# Patient Record
Sex: Male | Born: 1960 | Race: White | Hispanic: No | Smoking: Former smoker
Health system: Southern US, Community
[De-identification: ages and names within clinical notes are randomized; demographics above are authoritative.]

## PROBLEM LIST (undated history)

## (undated) DIAGNOSIS — F319 Bipolar disorder, unspecified: Secondary | ICD-10-CM

## (undated) DIAGNOSIS — Z9049 Acquired absence of other specified parts of digestive tract: Secondary | ICD-10-CM

## (undated) DIAGNOSIS — Z96662 Presence of left artificial ankle joint: Secondary | ICD-10-CM

## (undated) DIAGNOSIS — Z471 Aftercare following joint replacement surgery: Secondary | ICD-10-CM

## (undated) DIAGNOSIS — F419 Anxiety disorder, unspecified: Secondary | ICD-10-CM

## (undated) DIAGNOSIS — Z96693 Finger-joint replacement, bilateral: Secondary | ICD-10-CM

## (undated) HISTORY — DX: Acquired absence of other specified parts of digestive tract: Z90.49

## (undated) HISTORY — DX: Aftercare following joint replacement surgery: Z96.662

## (undated) HISTORY — PX: CHOLECYSTECTOMY: SHX55

## (undated) HISTORY — DX: Aftercare following joint replacement surgery: Z47.1

## (undated) HISTORY — DX: Aftercare following joint replacement surgery: Z96.693

## (undated) HISTORY — PX: ANKLE SURGERY: SHX546

## (undated) HISTORY — PX: FINGER SURGERY: SHX640

---

## 2011-12-09 ENCOUNTER — Emergency Department (HOSPITAL_COMMUNITY): Payer: Self-pay

## 2011-12-09 ENCOUNTER — Encounter (HOSPITAL_COMMUNITY): Payer: Self-pay | Admitting: *Deleted

## 2011-12-09 ENCOUNTER — Emergency Department (HOSPITAL_COMMUNITY)
Admission: EM | Admit: 2011-12-09 | Discharge: 2011-12-09 | Disposition: A | Discharge status: Home / Self Care | Payer: Self-pay | Attending: Emergency Medicine | Admitting: Emergency Medicine

## 2011-12-09 DIAGNOSIS — Z79899 Other long term (current) drug therapy: Secondary | ICD-10-CM | POA: Insufficient documentation

## 2011-12-09 DIAGNOSIS — W19XXXA Unspecified fall, initial encounter: Secondary | ICD-10-CM | POA: Insufficient documentation

## 2011-12-09 DIAGNOSIS — M25519 Pain in unspecified shoulder: Secondary | ICD-10-CM | POA: Insufficient documentation

## 2011-12-09 DIAGNOSIS — S42109A Fracture of unspecified part of scapula, unspecified shoulder, initial encounter for closed fracture: Secondary | ICD-10-CM | POA: Insufficient documentation

## 2011-12-09 DIAGNOSIS — Y9301 Activity, walking, marching and hiking: Secondary | ICD-10-CM | POA: Insufficient documentation

## 2011-12-09 MED ORDER — PROMETHAZINE HCL 12.5 MG PO TABS
12.5000 mg | ORAL_TABLET | Freq: Once | ORAL | Status: AC
  Administered 2011-12-09: 12.5 mg via ORAL
  Filled 2011-12-09: qty 1

## 2011-12-09 MED ORDER — HYDROCODONE-ACETAMINOPHEN 5-325 MG PO TABS: ORAL_TABLET | ORAL | Status: DC

## 2011-12-09 MED ORDER — HYDROCODONE-ACETAMINOPHEN 5-325 MG PO TABS
2.0000 | ORAL_TABLET | Freq: Once | ORAL | Status: AC
  Administered 2011-12-09: 2 via ORAL
  Filled 2011-12-09: qty 2

## 2011-12-09 MED ORDER — MELOXICAM 7.5 MG PO TABS
7.5000 mg | ORAL_TABLET | Freq: Every day | ORAL | Status: DC
Start: 2011-12-09 — End: 2011-12-13

## 2011-12-09 MED ORDER — KETOROLAC TROMETHAMINE 10 MG PO TABS
10.0000 mg | ORAL_TABLET | Freq: Once | ORAL | Status: AC
  Administered 2011-12-09: 10 mg via ORAL
  Filled 2011-12-09: qty 1

## 2011-12-09 NOTE — ED Notes (Signed)
Fell last night, pipe hit left shoulder. Occurred last night.

## 2011-12-09 NOTE — ED Notes (Signed)
Pt presents with left shoulder pain after falling and having a pipe land on shoulder last night. Pt has painful ROM. Skin WDI. Sensation intact. NAD at this time. Will continue to monitor.

## 2011-12-09 NOTE — ED Provider Notes (Signed)
History     CSN: [id number]  Arrival date & time 12/09/11  1436   First MD Initiated Contact with Patient 12/09/11 1615      Chief Complaint  Patient presents with  . Fall  . Shoulder Pain    (Consider location/radiation/quality/duration/timing/severity/associated sxs/prior treatment) Patient is a 51 y.o. male presenting with fall and shoulder pain. The history is provided by the patient.  Fall The accident occurred yesterday. The fall occurred while walking. Distance fallen: standing position. Impact surface: a pipe. The point of impact was the left shoulder. The pain is present in the left shoulder. The pain is severe. He was ambulatory at the scene. Pertinent negatives include no numbness, no abdominal pain, no bowel incontinence, no hematuria and no loss of consciousness. The symptoms are aggravated by activity. He has tried nothing for the symptoms. The treatment provided no relief.  Shoulder Pain Associated symptoms include coughing. Pertinent negatives include no abdominal pain, arthralgias, chest pain, neck pain or numbness.    History reviewed. No pertinent past medical history.  Past Surgical History  Procedure Date  . Ankle surgery   . Finger surgery     No family history on file.  History  Substance Use Topics  . Smoking status: Current Everyday Smoker  . Smokeless tobacco: Not on file  . Alcohol Use: Yes     Occ      Review of Systems  Constitutional: Negative for activity change.       All ROS Neg except as noted in HPI  HENT: Negative for nosebleeds and neck pain.   Eyes: Negative for photophobia and discharge.  Respiratory: Positive for cough. Negative for shortness of breath and wheezing.   Cardiovascular: Negative for chest pain and palpitations.  Gastrointestinal: Negative for abdominal pain, blood in stool and bowel incontinence.  Genitourinary: Negative for dysuria, frequency and hematuria.  Musculoskeletal: Negative for back pain and  arthralgias.  Skin: Negative.   Neurological: Negative for dizziness, seizures, loss of consciousness, speech difficulty and numbness.  Psychiatric/Behavioral: Negative for hallucinations and confusion.    Allergies  Review of patient's allergies indicates no known allergies.  Home Medications   Current Outpatient Rx  Name Route Sig Dispense Refill  . ALPRAZOLAM 1 MG PO TABS Oral Take 1 mg by mouth 4 (four) times daily.    Marland Kitchen CARBAMAZEPINE 200 MG PO TABS Oral Take 200 mg by mouth 2 (two) times daily.    . TRAZODONE HCL 100 MG PO TABS Oral Take 100 mg by mouth at bedtime.    Marland Kitchen HYDROCODONE-ACETAMINOPHEN 5-325 MG PO TABS  1 or 2 po q4h prn pain 20 tablet 0  . MELOXICAM 7.5 MG PO TABS Oral Take 1 tablet (7.5 mg total) by mouth daily. 12 tablet 0    BP 136/81  Pulse 87  Temp(Src) 97.8 F (36.6 C) (Oral)  Resp 20  Ht 5\' 10"  (1.778 m)  Wt 180 lb (81.647 kg)  BMI 25.83 kg/m2  SpO2 100%  Physical Exam  Nursing note and vitals reviewed. Constitutional: He is oriented to person, place, and time. He appears well-developed and well-nourished.  Non-toxic appearance.  HENT:  Head: Normocephalic.  Right Ear: Tympanic membrane and external ear normal.  Left Ear: Tympanic membrane and external ear normal.  Eyes: EOM and lids are normal. Pupils are equal, round, and reactive to light.  Neck: Normal range of motion. Neck supple. Carotid bruit is not present.  Cardiovascular: Normal rate, regular rhythm, normal heart sounds, intact distal pulses  and normal pulses.   Pulmonary/Chest: No respiratory distress. He has rhonchi.         Deformity of the clavicle .    Abdominal: Soft. Bowel sounds are normal. There is no tenderness. There is no guarding.  Musculoskeletal: Normal range of motion.       Distal pulses and sensory wnl. No pain of the left elbow or wrist.  Lymphadenopathy:       Head (right side): No submandibular adenopathy present.       Head (left side): No submandibular adenopathy  present.    He has no cervical adenopathy.  Neurological: He is alert and oriented to person, place, and time. He has normal strength. No cranial nerve deficit or sensory deficit.  Skin: Skin is warm and dry.  Psychiatric: He has a normal mood and affect. His speech is normal.    ED Course  Procedures (including critical care time)  Labs Reviewed - No data to display Dg Shoulder Left  12/09/2011  *RADIOLOGY REPORT*  Clinical Data: Severe left shoulder pain following a fall last night.  LEFT SHOULDER - 2+ VIEW  Comparison: None.  Findings: Old fracture of the mid to distal left clavicle with corticated margins and nonunion.  There is almost two shafts width of the inferior displacement as well as inferior angulation of the distal fragment as well as 4.6 cm of overlapping of the fragments. There is also an old, healed 6th posterolateral rib fracture. There is a fracture of the distal scapular body which appears possibly acute on the lateral scapular views.  IMPRESSION:  1.  Possible acute scapular fracture, as described above. 2.  Old left clavicle and sixth rib fractures.  Original Report Authenticated By: Sean Young, M.D.     1. Scapula fracture       MDM  I have reviewed nursing notes, vital signs, and all appropriate lab and imaging results for this patient. Pt sustained a fall last night. He fell on a pipe and injured the left scapula an shoulder. 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 Xray reveals possible scapular fracture.  Pt fitted with shoulder immobilizer and treated with mobic and norco. He is to call Dr Sean Young tomorrow for office appointment.       Kathie Dike, [place] 12/09/11 [phone]

## 2011-12-09 NOTE — Discharge Instructions (Signed)
You have a fracture of the shoulder blade on the left. Please call Dr Romeo Apple for orthopedic evaluation an management of your fracture. Mobic 2 times daily with food. Norco for pain if needed.Scapular Fracture You have a fracture (break in bone) of your scapula. This is your shoulder blade. It is the large flat bone behind your shoulder. This is also the bone that makes up the ball and socket joint of your shoulder. Most of the time surgery is not required for injuries to this bone unless the socket of the shoulder joint is involved. DIAGNOSIS  Because of the severity of force usually required to break this bone, x-rays are often taken of other bones likely to be injured at the same time. X-rays of the hip, knee, and pelvis may be taken. Specialized x-rays (arteriograms) may be needed if there are injuries to large blood vessels associated with this injury. HOME CARE INSTRUCTIONS   Simple fractures of the scapula can be treated with a sling and swathe type of immobilization. This means the involved area is held in place by putting the arm in a sling. A wrap is made around the upper arm with the sling holding the arm next to the chest. This may be removed for bathing as instructed by your caregiver.   Apply ice to the injury for 15 to 20 minutes 3 to 4 times per day. Put the ice in a plastic bag. Place a towel between the bag of ice and your skin, splint, or immobilization device.   Do not resume use until instructed by your caregiver. Usually full rehabilitation (exercises to improve the injury site) will begin sometime after the sling and swathe are removed. Then begin use gradually as directed. Do not increase use to the point of pain. If pain develops, decrease use and continue the above measures. Slowly increase activities that do not cause discomfort until you gradually achieve normal use without pain.   Only take over-the-counter or prescription medicines for pain, discomfort, or fever as directed  by your caregiver.  SEEK IMMEDIATE MEDICAL CARE IF:   Your pain and swelling increase and is uncontrolled with medications.   You develop new, unexplained symptoms or an increase of the symptoms which brought you to your caregiver.   You develop shortness or breath or cough up blood.   You are unable to move your arm or fingers. You develop warmth and swelling in your affected arm.   You develop an unexplained temperature.  Document Released: 07/30/2005 Document Revised: 07/19/2011 Document Reviewed: 06/21/2006 [facility] Patient Information 2012 [place], [place].

## 2011-12-09 NOTE — ED Notes (Signed)
Pt a/ox4. Resp even and unlabored. NAD at this time. D/C instructions reviewed with pt. Pt verbalized understanding. Pt ambulated to lobby with steady gate.  

## 2011-12-13 ENCOUNTER — Encounter (HOSPITAL_COMMUNITY): Payer: Self-pay | Admitting: Emergency Medicine

## 2011-12-13 ENCOUNTER — Emergency Department (HOSPITAL_COMMUNITY)
Admission: EM | Admit: 2011-12-13 | Discharge: 2011-12-13 | Disposition: A | Discharge status: Home / Self Care | Payer: Self-pay | Attending: Emergency Medicine | Admitting: Emergency Medicine

## 2011-12-13 DIAGNOSIS — F17200 Nicotine dependence, unspecified, uncomplicated: Secondary | ICD-10-CM | POA: Insufficient documentation

## 2011-12-13 DIAGNOSIS — M25519 Pain in unspecified shoulder: Secondary | ICD-10-CM | POA: Insufficient documentation

## 2011-12-13 DIAGNOSIS — M2550 Pain in unspecified joint: Secondary | ICD-10-CM | POA: Insufficient documentation

## 2011-12-13 MED ORDER — OXYCODONE-ACETAMINOPHEN 5-325 MG PO TABS: 1.0000 | ORAL_TABLET | ORAL | Status: AC | PRN

## 2011-12-13 NOTE — ED Provider Notes (Signed)
History     CSN: [id number]  Arrival date & time 12/13/11  1709   First MD Initiated Contact with Patient 12/13/11 1721      Chief Complaint  Patient presents with  . Medication Refill    (Consider location/radiation/quality/duration/timing/severity/associated sxs/prior treatment) HPI Comments: Patient c/o persistent pain to the left scapula.  States he was seen here and treated for a scapula fracture 4 days ago and states the pain medication he was given is not controlling the pain.  States he took his last one yesterday.  He states he has appt with Dr. Romeo Apple on Monday.  He denies numbness, weakness, swelling , neck pain or chest pain  Patient is a 51 y.o. male presenting with shoulder pain. The history is provided by the patient.  Shoulder Pain This is a new problem. The current episode started in the past 7 days. The problem occurs constantly. The problem has been unchanged. Associated symptoms include arthralgias. Pertinent negatives include no chest pain, fever, headaches, joint swelling, myalgias, neck pain, numbness, rash or weakness. Exacerbated by: movement and palpation. He has tried oral narcotics for the symptoms. The treatment provided no relief.    History reviewed. No pertinent past medical history.  Past Surgical History  Procedure Date  . Ankle surgery   . Finger surgery     History reviewed. No pertinent family history.  History  Substance Use Topics  . Smoking status: Current Everyday Smoker  . Smokeless tobacco: Not on file  . Alcohol Use: Yes     Occ      Review of Systems  Constitutional: Negative for fever.  HENT: Negative for neck pain and neck stiffness.   Respiratory: Negative for chest tightness and shortness of breath.   Cardiovascular: Negative for chest pain.  Musculoskeletal: Positive for arthralgias. Negative for myalgias, back pain and joint swelling.  Skin: Negative for rash.  Neurological: Negative for dizziness, weakness, numbness  and headaches.  All other systems reviewed and are negative.    Allergies  Review of patient's allergies indicates no known allergies.  Home Medications   Current Outpatient Rx  Name Route Sig Dispense Refill  . ALPRAZOLAM 1 MG PO TABS Oral Take 1 mg by mouth 4 (four) times daily.    Marland Kitchen CARBAMAZEPINE 200 MG PO TABS Oral Take 200 mg by mouth 2 (two) times daily.    . METHYLPHENIDATE HCL 10 MG PO TABS Oral Take 10 mg by mouth 2 (two) times daily.    . METHYLPHENIDATE HCL 20 MG PO TABS Oral Take 20 mg by mouth 2 (two) times daily.    . TRAZODONE HCL 100 MG PO TABS Oral Take 100 mg by mouth at bedtime.    Marland Kitchen HYDROCODONE-ACETAMINOPHEN 5-325 MG PO TABS Oral Take 1 tablet by mouth every 6 (six) hours as needed. For pain      BP 140/68  Pulse 86  Temp(Src) 97.6 F (36.4 C) (Oral)  Resp 16  Ht 5\' 10"  (1.778 m)  Wt 180 lb (81.647 kg)  BMI 25.83 kg/m2  SpO2 100%  Physical Exam  Nursing note and vitals reviewed. Constitutional: He is oriented to person, place, and time. He appears well-developed and well-nourished. No distress.  HENT:  Head: Normocephalic and atraumatic.  Mouth/Throat: Oropharynx is clear and moist.  Neck: Normal range of motion. Neck supple.  Cardiovascular: Normal rate, regular rhythm and normal heart sounds.   Pulmonary/Chest: Effort normal and breath sounds normal. No respiratory distress. He exhibits no tenderness.  Musculoskeletal: He  exhibits tenderness. He exhibits no edema.       Left shoulder: He exhibits decreased range of motion, tenderness, bony tenderness and pain. He exhibits no swelling, no effusion, no crepitus, no deformity, no laceration, no spasm, normal pulse and normal strength.       Arms:      Localized ttp of the mid to distal portion of the left scapula,  No bruising or edema.  Pain also reproduced with abduction of the left arm.  Lymphadenopathy:    He has no cervical adenopathy.  Neurological: He is alert and oriented to person, place, and  time. He exhibits normal muscle tone. Coordination normal.       Left radial pulse is brisk, CR,<2 sec, sensation intact.  Skin: Skin is warm and dry.    ED Course  Procedures (including critical care time)       MDM    Previous medical charts, nursing notes and vitals signs from this visit were reviewed by me   All laboratory results and/or imaging results performed on this visit, if applicable, were reviewed by me and discussed with the patient and/or parent as well as recommendation for follow-up    MEDICATIONS GIVEN IN ED: none  Patient here 12/09/11 diagnosed with left scapular fx, wearing a sling.  Remains NV intact.  ttp of the distal portion of the scapula. No chest wall or cervical tenderness. States he has appt with Dr. Romeo Apple for Monday.  Requesting pain medication until appt.        PRESCRIPTIONS GIVEN AT DISCHARGE:  Percocet # 10     Pt stable in ED with no significant deterioration in condition. Pt feels improved after observation and/or treatment in ED. Patient / Family / Caregiver understand and agree with initial ED impression and plan with expectations set for ED visit.  Patient agrees to return to ED for any worsening symptoms        Jabir Dahlem L. [place], [place] 12/13/11 1811

## 2011-12-13 NOTE — ED Provider Notes (Signed)
Medical screening examination/treatment/procedure(s) were performed by non-physician practitioner and as supervising physician I was immediately available for consultation/collaboration.  Eschol Auxier, MD 12/13/11 2237 

## 2011-12-13 NOTE — Discharge Instructions (Signed)
Shoulder Pain  The shoulder is a ball and socket joint. Many muscles and tendons hold the joint together. Many types of injuries and medical problems can cause pain in one or more parts of the shoulder.  HOME CARE   If your doctor feels the problem is not serious, it may help to do the following:  · Put ice on the area.  · Put ice in a plastic bag.  · Place a towel between your skin and the bag.  · Leave the ice on for 15 to 20 minutes, 3 to 4 times a day.  · Do this for the first 2 day or as told by your doctor.  · Stop using cold packs if they do not help with the pain.  · Do not take your sling off (except to shower or bathe) until you see your doctor. When taking off the sling, move the arm as little as possible.  · Take medicine as told by your doctor.  · Keep all follow-up appointments.  GET HELP RIGHT AWAY IF:   · The arm, hand, or fingers are numb or tingling.  · The arm, hand, or fingers are puffy (swollen), painful, or turn white or blue.  · You have trouble moving your hand and fingers on the injured side.  · You have chest pain or shortness of breath.  · New pain happens in the arm, hand, or fingers.  · The hand or fingers on the injured side become cold.  · The medicine is not helping the pain go away.  MAKE SURE YOU:   · Understand these instructions.  · Will watch your condition.  · Will get help right away if you are not doing well or get worse.  Document Released: 01/16/2008 Document Revised: 07/19/2011 Document Reviewed: 01/16/2008  [employer]® Patient Information ©2012 [employer].

## 2011-12-13 NOTE — ED Notes (Signed)
Pt seen in ED for left broken shoulder on Sunday. Pt unable to get in with Dr.Harrison until Monday afternoon. Pt here for pain medication to last until appointment.

## 2011-12-17 ENCOUNTER — Encounter: Payer: Self-pay | Admitting: Orthopedic Surgery

## 2011-12-17 ENCOUNTER — Ambulatory Visit: Payer: Self-pay | Admitting: Orthopedic Surgery

## 2011-12-27 NOTE — ED Provider Notes (Signed)
Medical screening examination/treatment/procedure(s) were performed by non-physician practitioner and as supervising physician I was immediately available for consultation/collaboration.  Nicoletta Dress. Colon Branch, MD 12/27/11 [phone]

## 2014-03-08 ENCOUNTER — Emergency Department (HOSPITAL_COMMUNITY)
Admission: EM | Admit: 2014-03-08 | Discharge: 2014-03-08 | Disposition: A | Discharge status: Home / Self Care | Payer: Self-pay | Attending: Emergency Medicine | Admitting: Emergency Medicine

## 2014-03-08 ENCOUNTER — Encounter (HOSPITAL_COMMUNITY): Payer: Self-pay | Admitting: Emergency Medicine

## 2014-03-08 DIAGNOSIS — Z792 Long term (current) use of antibiotics: Secondary | ICD-10-CM | POA: Insufficient documentation

## 2014-03-08 DIAGNOSIS — L02416 Cutaneous abscess of left lower limb: Secondary | ICD-10-CM

## 2014-03-08 DIAGNOSIS — Z79899 Other long term (current) drug therapy: Secondary | ICD-10-CM | POA: Insufficient documentation

## 2014-03-08 DIAGNOSIS — L03119 Cellulitis of unspecified part of limb: Principal | ICD-10-CM

## 2014-03-08 DIAGNOSIS — L02419 Cutaneous abscess of limb, unspecified: Secondary | ICD-10-CM | POA: Insufficient documentation

## 2014-03-08 DIAGNOSIS — L03116 Cellulitis of left lower limb: Secondary | ICD-10-CM

## 2014-03-08 DIAGNOSIS — Z87891 Personal history of nicotine dependence: Secondary | ICD-10-CM | POA: Insufficient documentation

## 2014-03-08 DIAGNOSIS — F319 Bipolar disorder, unspecified: Secondary | ICD-10-CM | POA: Insufficient documentation

## 2014-03-08 DIAGNOSIS — F411 Generalized anxiety disorder: Secondary | ICD-10-CM | POA: Insufficient documentation

## 2014-03-08 HISTORY — DX: Bipolar disorder, unspecified: F31.9

## 2014-03-08 HISTORY — DX: Anxiety disorder, unspecified: F41.9

## 2014-03-08 MED ORDER — SULFAMETHOXAZOLE-TRIMETHOPRIM 200-40 MG/5ML PO SUSP: 20.0000 mL | Freq: Once | ORAL | Status: DC

## 2014-03-08 MED ORDER — SULFAMETHOXAZOLE-TRIMETHOPRIM 800-160 MG PO TABS: 1.0000 | ORAL_TABLET | Freq: Two times a day (BID) | ORAL | Status: DC

## 2014-03-08 MED ORDER — POVIDONE-IODINE 10 % EX SOLN
CUTANEOUS | Status: AC
  Filled 2014-03-08: qty 118

## 2014-03-08 MED ORDER — LIDOCAINE-EPINEPHRINE (PF) 1 %-1:200000 IJ SOLN: 10.0000 mL | Freq: Once | INTRAMUSCULAR | Status: DC

## 2014-03-08 MED ORDER — OXYCODONE-ACETAMINOPHEN 5-325 MG PO TABS
1.0000 | ORAL_TABLET | ORAL | Status: DC | PRN
Start: 2014-03-08 — End: 2020-02-05

## 2014-03-08 MED ORDER — SULFAMETHOXAZOLE-TMP DS 800-160 MG PO TABS
1.0000 | ORAL_TABLET | Freq: Once | ORAL | Status: AC
  Administered 2014-03-08: 1 via ORAL
  Filled 2014-03-08: qty 1

## 2014-03-08 MED ORDER — IBUPROFEN 400 MG PO TABS
600.0000 mg | ORAL_TABLET | Freq: Once | ORAL | Status: AC
  Administered 2014-03-08: 600 mg via ORAL
  Filled 2014-03-08: qty 2

## 2014-03-08 MED ORDER — OXYCODONE-ACETAMINOPHEN 5-325 MG PO TABS
2.0000 | ORAL_TABLET | Freq: Once | ORAL | Status: AC
  Administered 2014-03-08: 2 via ORAL
  Filled 2014-03-08: qty 2

## 2014-03-08 NOTE — ED Notes (Signed)
Pt states he noticed area behind right knee after mowing 5 days ago. Redness and swelling are present. Denies drainage from wound. Pt states entire right leg is hurting and is causing difficulty walking due to pain.

## 2014-03-08 NOTE — ED Notes (Signed)
I&D tray at bedside.

## 2014-03-08 NOTE — Discharge Instructions (Signed)
Abscess °An abscess is an infected area that contains a collection of pus and debris. It can occur in almost any part of the body. An abscess is also known as a furuncle or boil. °CAUSES  °An abscess occurs when tissue gets infected. This can occur from blockage of oil or sweat glands, infection of hair follicles, or a minor injury to the skin. As the body tries to fight the infection, pus collects in the area and creates pressure under the skin. This pressure causes pain. People with weakened immune systems have difficulty fighting infections and get certain abscesses more often.  °SYMPTOMS °Usually an abscess develops on the skin and becomes a painful mass that is red, warm, and tender. If the abscess forms under the skin, you may feel a moveable soft area under the skin. Some abscesses break open (rupture) on their own, but most will continue to get worse without care. The infection can spread deeper into the body and eventually into the bloodstream, causing you to feel ill.  °DIAGNOSIS  °Your caregiver will take your medical history and perform a physical exam. A sample of fluid may also be taken from the abscess to determine what is causing your infection. °TREATMENT  °Your caregiver may prescribe antibiotic medicines to fight the infection. However, taking antibiotics alone usually does not cure an abscess. Your caregiver may need to make a small cut (incision) in the abscess to drain the pus. In some cases, gauze is packed into the abscess to reduce pain and to continue draining the area. °HOME CARE INSTRUCTIONS  °· Only take over-the-counter or prescription medicines for pain, discomfort, or fever as directed by your caregiver. °· If you were prescribed antibiotics, take them as directed. Finish them even if you start to feel better. °· If gauze is used, follow your caregiver's directions for changing the gauze. °· To avoid spreading the infection: °¨ Keep your draining abscess covered with a  bandage. °¨ Wash your hands well. °¨ Do not share personal care items, towels, or whirlpools with others. °¨ Avoid skin contact with others. °· Keep your skin and clothes clean around the abscess. °· Keep all follow-up appointments as directed by your caregiver. °SEEK MEDICAL CARE IF:  °· You have increased pain, swelling, redness, fluid drainage, or bleeding. °· You have muscle aches, chills, or a general ill feeling. °· You have a fever. °MAKE SURE YOU:  °· Understand these instructions. °· Will watch your condition. °· Will get help right away if you are not doing well or get worse. °Document Released: 05/09/2005 Document Revised: 01/29/2012 Document Reviewed: 10/12/2011 °ExitCare® Patient Information ©2015 [employer]. This information is not intended to replace advice given to you by your health care provider. Make sure you discuss any questions you have with your health care provider. ° ° °Emergency Department Resource Guide °1) Find a Doctor and Pay Out of Pocket °Although you won't have to find out who is covered by your insurance plan, it is a good idea to ask around and get recommendations. You will then need to call the office and see if the doctor you have chosen will accept you as a new patient and what types of options they offer for patients who are self-pay. Some doctors offer discounts or will set up payment plans for their patients who do not have insurance, but you will need to ask so you aren't surprised when you get to your appointment. ° °2) Contact Your Local Health Department °Not all health departments   have doctors that can see patients for sick visits, but many do, so it is worth a call to see if yours does. If you don't know where your local health department is, you can check in your phone book. The CDC also has a tool to help you locate your state's health department, and many state websites also have listings of all of their local health departments. ° °3) Find a Walk-in Clinic °If  your illness is not likely to be very severe or complicated, you may want to try a walk in clinic. These are popping up all over the country in pharmacies, drugstores, and shopping centers. They're usually staffed by nurse practitioners or physician assistants that have been trained to treat common illnesses and complaints. They're usually fairly quick and inexpensive. However, if you have serious medical issues or chronic medical problems, these are probably not your best option. ° °No Primary Care Doctor: °- Call [facility] at  832-8000 - they can help you locate a primary care doctor that  accepts your insurance, provides certain services, etc. °- Physician Referral Service- 1-800-533-3463 ° °Chronic Pain Problems: °Organization         Address  Phone   Notes  °[facility]  (336) 297-2271 Patients need to be referred by their primary care doctor.  ° °Medication Assistance: °Organization         Address  Phone   Notes  °[place] [facility] [address] °[place], [place] [address] (336) 641-8030 --Must be a resident of [place] °-- Must have NO insurance coverage whatsoever (no Medicaid/ Medicare, etc.) °-- The pt. MUST have a primary care doctor that directs their care regularly and follows them in the community °  °[facility]  (866) 331-1348   °[employer]  (888) 892-1162   ° °Agencies that provide inexpensive medical care: °Organization         Address  Phone   Notes  °[facility]  (336) 832-8035   °[facility]    (336) 832-7272   °[facility] [address] °[place], [place] [address] (336) 832-4777   °[facility] [address], °[place] (336) 271-4999   °[facility]    (336) 373-0678   °[facility]    (336) 272-1050   °[facility] ° [address], [place] Phone:  (336) 832-4444, Fax:  (336) 832-4440 Hours of  Operation:  9 am - 6 pm, M-F.  Also accepts Medicaid/Medicare and self-pay.  °[facility] ° [address], [place] Phone: (336) 832-3150, Fax: (336) 832-3151. Hours of Operation:  8:30 am - 5:30 pm, M-F.  Also accepts Medicaid and self-pay.  °[facility] [place] [address], [place] Phone: (336) 878-6027   °[facility] [address], [place], [place] (336)723-1848, Ext. 123 Mondays & Thursdays: 7-9 AM.  First 15 patients are seen on a first come, first serve basis. °  ° °Medicaid-accepting [place] Providers: ° °Organization         Address  Phone   Notes  °[facility] [address], [place] (336) 641-2100 Also accepts self-pay patients.  °[facility] [address], [place] ° (336) 856-9996   °[facility] [address], [place] (336) 288-8857   °[facility]   [address] [place] [address], [place] (336) 299-7000   °Veita Bland [address], [place]  ° (336) 373-1557 Only accepts [place] Access Medicaid patients after they have their name applied to their card.  ° °Self-Pay (no insurance) in [place]: ° °Organization         Address  Phone   Notes  °Sickle Cell Patients, [facility] [address], [place] (336) 832-1970   °[facility] [address], [place] (336) 832-4400   ° [facility] ° [address], [place] (336) 992-4800   °[facility]/Dr. Osei-Bonsu ° [address] [place] [address], [place] or [address], [place] (336) 841-8500 Phone number for both [place] and [place] locations is the same.  °[facility] [address], [place] (336) 299-0000   °[facility] [address] [place] [address], [place] or [address] (336) 852-7530 °(336) 878-2260   °[facility] [address], [place] (336) 350-1642, phone; (336) 294-5005, fax Sees patients 1st and 3rd Saturday of every month.  Must not qualify for public or private insurance (i.e. Medicaid, Medicare, [place] Health Choice, Veterans' Benefits) • Household income should be no more than 200% of the poverty level •The clinic cannot treat you if you are pregnant or think you are pregnant • Sexually transmitted diseases are not treated at the clinic.  ° ° °Dental Care: °Organization         Address  Phone  Notes  °[facility] [address], [place] (336) 641-6152 Accepts children up to age 21 who are enrolled in Medicaid or [place] Health Choice; pregnant women with a Medicaid card; and children who have applied for Medicaid or [place] Health Choice, but were declined, whose parents can pay a reduced fee at time of service.  °[facility] [place]  [address], [place] (336) 641-7733 Accepts children up to age 21 who are enrolled in Medicaid or [place] Health Choice; pregnant women with a Medicaid card; and children who have applied for Medicaid or  Health Choice, but were declined, whose parents can pay a reduced fee at time of service.  °[facility] ° [address], [place] (336) 641-4533 Patients are seen by appointment only. Walk-ins are not accepted. [facility] will see patients 18 years of age and older. °Monday - Tuesday (8am-5pm) °Most Wednesdays (8:30-5pm) °$30 per visit, cash only  °[facility] ° [address], [place] (336) 641-4533 Patients are seen by appointment only. Walk-ins are not accepted. [facility] will see patients 18 years of age and older. °One Wednesday Evening (Monthly: Volunteer Based).  $30 per visit, cash only  °[facility]  (919) 537-3737 for adults; Children under age 4, call Graduate Pediatric  Dentistry at ([phone]) [phone]. Children aged 4-14, please call (919) [phone] to request a pediatric application. ° Dental services are provided in all areas of dental care including fillings, crowns and bridges, complete and partial dentures, implants, gum treatment, root canals, and extractions. Preventive care is also provided. Treatment is provided to both adults and children. °Patients are selected via a lottery and there is often a waiting list. °  °[facility] [address], ° ° (336) 763-8833 [email or web address] °  °[facility]   [address], [place], [place] (336)723-1848, Ext. 123 Second and Fourth Thursday of each month, opens at 6:30 AM; Clinic ends at 9 AM.  Patients are seen on a first-come first-served basis, and a limited number are seen during each clinic.  ° °[facility] ° [address], [place], [place] (336) 723-7904   Eligibility Requirements °You must have lived in [place], [place], or [place] counties for at least the last three months. °  You cannot be eligible for state or federal sponsored healthcare insurance, including Veterans Administration, Medicaid, or Medicare. °  You generally cannot be eligible for healthcare insurance through your employer.  °  How to apply: °Eligibility screenings are held every Tuesday and Wednesday afternoon from 1:00 pm until 4:00 pm. You do not need an appointment for the interview!  °[facility] [address], [place], [place] 336-631-2330   °[facility]  336-342-8273   °[facility]  336-703-3100   °[facility]  336-570-6415   ° °Behavioral Health Resources in the Community: °Intensive Outpatient Programs °Organization         Address  Phone  Notes  °[facility] [address], [place], [place] 336-878-6098   °[facility] [address], [place], [place] 336-832-9800   °[facility] [address], [place], [place] ° 336-882-2125   °[facility] [address],  °[place], [place] 1-800-853-5163 or 336-641-4981   °Substance Abuse Resources °Organization         Address  Phone  Notes  °[facility]  336-882-2125   °[facility]  336-784-9470   °[facility]  336-285-9073   °[facility]  336-845-3988   °Residential & Outpatient Substance Abuse Program  1-800-659-3381   °Psychological Services °Organization         Address  Phone  Notes  °[facility]  336- 832-9600   °[facility]  336- 378-7881   °[facility] [address], [place] 1-800-853-5163 or 336-641-4981   ° °Mobile Crisis Teams °Organization         Address  Phone  Notes  °[facility], Mobile Crisis Care Unit  1-877-626-1772   °Assertive °[facility] ° [address] [place], [place] 336-834-9664   °Sharon DeEsch [address] °[place] 336-554-5454   ° °Self-Help/Support Groups °Organization         Address  Phone             Notes  °[facility] - variety of support groups  336- 373-1402 Call for more information  °Narcotics Anonymous (NA), [facility] [address], °[place]  2 meetings at this location  ° °Residential Treatment Programs °Organization         Address  Phone  Notes  °[facility] [address],    °[place]  1-866-801-8205   °[facility] ° [address], [place], [place] 704-293-8524   °[facility] [address], [place] 336-845-3988 Admissions: 8am-3pm M-F  °[facility] [address]    °[place], [place] 336-841-1104   °[facility] [address], [place], [place] 336-379-7146   °[facility] [address]  °[place], [place] 336-285-9073   °[facility] - Intensive Outpatient [address],    [place], [place] 336-852-3033   °[facility] [address]  °[place], [place] 1-877-615-2722 or 336-784-9470   °[facility] [address] [place], [place] 336-227-7417 Accepts Medicaid  °[facility] [address]  °[place] 1-800-659-3381 Substance Abuse/Addiction Treatment  ° °[place] Behavioral Health Resources °Organization         Address  Phone  Notes  °[facility]  (888) 581-9988   °Julie Brannon, PhD [address] [place], [place]   (336) 349-5553 or (336) 951-0000   °[facility]   [address] °[place], [place] (336) 349-4454   °[facility] [address], [place], [place] (336) 342-8316 Insurance/Medicaid/sponsorship through [facility]  °[facility] [address]                                    [place], [place] (336) 342-8316 Therapy/tele-psych/case  °[facility] [address]  ° [place], [place] (336) 349-2233    °Dr. Arfeen  (336) 349-4544   °[facility] [place]  [employer] [place] [facility] 1) [address],  °2) [address], [place] °3)  [address], [place] (336) 349-3220 °(336) 342-7768 ° °(336) 342-8140   °[place] [facility] (336) 342-1394 or (336) 342-3537 (After Hours)    ° ° ° °

## 2014-03-13 NOTE — ED Provider Notes (Signed)
CSN: [id number]     Arrival date & time 03/08/14  1707 History   First MD Initiated Contact with Patient 03/08/14 1728     Chief Complaint  Patient presents with  . Abscess     (Consider location/radiation/quality/duration/timing/severity/associated sxs/prior Treatment) HPI  53yM with painful RLE lesion. Onset 5d ago. Progressively worsening. First noticed while mowing lawn. Things may have been bitten by spider although didn't specifically see this. No fever or chills. No n/v. Denies IV drug use.   Past Medical History  Diagnosis Date  . Bipolar 1 disorder   . Anxiety    Past Surgical History  Procedure Laterality Date  . Ankle surgery    . Finger surgery     No family history on file. History  Substance Use Topics  . Smoking status: Former Smoker    Quit date: 03/08/2013  . Smokeless tobacco: Not on file  . Alcohol Use: Yes     Comment: Occ    Review of Systems  All systems reviewed and negative, other than as noted in HPI.   Allergies  Review of patient's allergies indicates no known allergies.  Home Medications   Prior to Admission medications   Medication Sig Start Date End Date Taking? Authorizing Provider  ALPRAZolam Prudy Feeler) 1 MG tablet Take 1 mg by mouth 4 (four) times daily.   Yes Historical Provider, MD  carbamazepine (TEGRETOL) 200 MG tablet Take 200 mg by mouth 2 (two) times daily.   Yes Historical Provider, MD  traZODone (DESYREL) 100 MG tablet Take 100 mg by mouth at bedtime.   Yes Historical Provider, MD  oxyCODONE-acetaminophen (PERCOCET/ROXICET) 5-325 MG per tablet Take 1-2 tablets by mouth every 4 (four) hours as needed for severe pain. 03/08/14   Raeford Razor, MD  sulfamethoxazole-trimethoprim (SEPTRA DS) 800-160 MG per tablet Take 1 tablet by mouth every 12 (twelve) hours. 03/08/14   Raeford Razor, MD   BP 142/83  Pulse 89  Temp(Src) 98.2 F (36.8 C) (Oral)  Resp 16  Ht 5\' 10"  (1.778 m)  Wt 180 lb (81.647 kg)  BMI 25.83 kg/m2  SpO2  100% Physical Exam  Nursing note and vitals reviewed. Constitutional: He appears well-developed and well-nourished. No distress.  HENT:  Head: Normocephalic and atraumatic.  Eyes: Conjunctivae are normal. Right eye exhibits no discharge. Left eye exhibits no discharge.  Neck: Neck supple.  Cardiovascular: Normal rate, regular rhythm and normal heart sounds.  Exam reveals no gallop and no friction rub.   No murmur heard. Pulmonary/Chest: Effort normal and breath sounds normal. No respiratory distress.  Abdominal: Soft. He exhibits no distension. There is no tenderness.  Musculoskeletal: He exhibits no edema and no tenderness.  Neurological: He is alert.  Skin: Skin is warm and dry.     Erythematous, indurated lesion in depicted area. No fluctuance but bedside US does show fluid collection. Cellulitis extending both proximally and distally on posterior aspect of leg. Able to actively range knee.  Psychiatric: He has a normal mood and affect. His behavior is normal. Thought content normal.    ED Course  Procedures (including critical care time)  INCISION AND DRAINAGE Performed by: Raeford Razor Consent: Verbal consent obtained. Risks and benefits: risks, benefits and alternatives were discussed Type: abscess  Body area:  LLE  Anesthesia: local infiltration  Incision was made with a scalpel.  Local anesthetic: lidocaine 2% w epinephrine  Anesthetic total: 3  ml  Complexity: complex Blunt dissection to break up loculations  Drainage: purulent  Drainage amount:  moderate  Patient tolerance: Patient tolerated the procedure well with no immediate complications.    Labs Review Labs Reviewed - No data to display  Imaging Review No results found.   EKG Interpretation None      MDM   Final diagnoses:  Abscess of leg without foot, left  Cellulitis of left lower extremity    53 year old male with abscess right lower extremity. Incised and drained. Surrounding  cellulitis. Will place on antibiotics. Continued wound care. Afebrile. No particular complicating features. I feel is appropriate for outpatient treatment at this time. As needed pain medication for couple days. Return precautions were discussed.    Raeford RazorStephen Meghan Tiemann, MD 03/13/14 [phone]

## 2020-02-05 ENCOUNTER — Other Ambulatory Visit: Payer: Self-pay

## 2020-02-05 ENCOUNTER — Emergency Department (HOSPITAL_COMMUNITY): Payer: Medicaid - Out of State

## 2020-02-05 ENCOUNTER — Encounter (HOSPITAL_COMMUNITY): Payer: Self-pay | Admitting: *Deleted

## 2020-02-05 ENCOUNTER — Emergency Department (HOSPITAL_COMMUNITY)
Admission: EM | Admit: 2020-02-05 | Discharge: 2020-02-05 | Disposition: A | Discharge status: Home / Self Care | Payer: Medicaid - Out of State | Attending: Emergency Medicine | Admitting: Emergency Medicine

## 2020-02-05 DIAGNOSIS — W01190A Fall on same level from slipping, tripping and stumbling with subsequent striking against furniture, initial encounter: Secondary | ICD-10-CM | POA: Insufficient documentation

## 2020-02-05 DIAGNOSIS — Z79899 Other long term (current) drug therapy: Secondary | ICD-10-CM | POA: Diagnosis not present

## 2020-02-05 DIAGNOSIS — Y9389 Activity, other specified: Secondary | ICD-10-CM | POA: Insufficient documentation

## 2020-02-05 DIAGNOSIS — Z7951 Long term (current) use of inhaled steroids: Secondary | ICD-10-CM | POA: Insufficient documentation

## 2020-02-05 DIAGNOSIS — Z87891 Personal history of nicotine dependence: Secondary | ICD-10-CM | POA: Insufficient documentation

## 2020-02-05 DIAGNOSIS — Y999 Unspecified external cause status: Secondary | ICD-10-CM | POA: Insufficient documentation

## 2020-02-05 DIAGNOSIS — S2241XA Multiple fractures of ribs, right side, initial encounter for closed fracture: Secondary | ICD-10-CM | POA: Insufficient documentation

## 2020-02-05 DIAGNOSIS — Y929 Unspecified place or not applicable: Secondary | ICD-10-CM | POA: Insufficient documentation

## 2020-02-05 DIAGNOSIS — S299XXA Unspecified injury of thorax, initial encounter: Secondary | ICD-10-CM | POA: Diagnosis present

## 2020-02-05 MED ORDER — NAPROXEN 250 MG PO TABS
500.0000 mg | ORAL_TABLET | Freq: Once | ORAL | Status: AC
  Administered 2020-02-05: 500 mg via ORAL
  Filled 2020-02-05: qty 2

## 2020-02-05 MED ORDER — OXYCODONE-ACETAMINOPHEN 5-325 MG PO TABS: 1.0000 | ORAL_TABLET | ORAL | 0 refills | Status: DC | PRN

## 2020-02-05 NOTE — ED Notes (Signed)
Patient transported to X-ray 

## 2020-02-05 NOTE — ED Provider Notes (Signed)
[facility] EMERGENCY DEPARTMENT Provider Note   CSN: [id number] Arrival date & time: 02/05/20  2011     History Chief Complaint  Patient presents with  . Fall    Sean Young is a 59 y.o. male.  HPI      Sean Young is a 59 y.o. male who presents to the Emergency Department complaining of right sided chest wall pain after a mechanical fall in which he landed on the edge of a coffee table.  He states that he tripped over his dog which caused the fall.  He immediately felt pain along his posterior right ribs.  He states pain is worse with movement, cough, and deep breathing.  Incident occurred 1 day prior to arrival.  He states that he feels movement in his right upper back when he takes a deep breath or coughs.  He denies hemoptysis, abdominal pain, and shortness of breath.  Symptoms improved slightly when he remains at rest.    Past Medical History:  Diagnosis Date  . Anxiety   . Bipolar 1 disorder (HCC)     There are no problems to display for this patient.   Past Surgical History:  Procedure Laterality Date  . ANKLE SURGERY    . CHOLECYSTECTOMY    . FINGER SURGERY         No family history on file.  Social History   Tobacco Use  . Smoking status: Former Smoker    Quit date: 03/08/2013    Years since quitting: 6.9  . Smokeless tobacco: Former Engineer, water Use Topics  . Alcohol use: Yes    Comment: Occ  . Drug use: No    Home Medications Prior to Admission medications   Medication Sig Start Date End Date Taking? Authorizing Provider  albuterol (PROVENTIL HFA) 108 (90 Base) MCG/ACT inhaler Inhale 1-2 puffs into the lungs every 6 (six) hours as needed for wheezing or shortness of breath.  06/18/17  Yes [provider]  albuterol (PROVENTIL) (2.5 MG/3ML) 0.083% nebulizer solution Take 2.5 mg by nebulization every 6 (six) hours as needed for wheezing or shortness of breath.  12/23/19  Yes [provider]  amitriptyline (ELAVIL) 100 MG  tablet Take 100 mg by mouth at bedtime.  01/08/20  Yes [provider]  budesonide (PULMICORT) 0.5 MG/2ML nebulizer solution Inhale 2 mLs into the lungs 2 (two) times daily as needed (for shortness of breath).  12/23/19  Yes [provider]  fexofenadine (ALLEGRA) 180 MG tablet Take 180 mg by mouth daily.  01/25/20  Yes [provider]  fluticasone (FLONASE) 50 MCG/ACT nasal spray Place 2 sprays into the nose daily as needed for allergies or rhinitis.  11/04/19  Yes [provider]  ipratropium (ATROVENT) 0.02 % nebulizer solution Inhale 2.5 mLs into the lungs 2 (two) times daily as needed for wheezing or shortness of breath.  01/25/20  Yes [provider]  pantoprazole (PROTONIX) 40 MG tablet Take 40 mg by mouth daily.  01/08/20  Yes [provider]  predniSONE (DELTASONE) 20 MG tablet Take 20 mg by mouth daily with breakfast.  10/12/19  Yes [provider]  sulfamethoxazole-trimethoprim (SEPTRA DS) 800-160 MG per tablet Take 1 tablet by mouth every 12 (twelve) hours. Patient taking differently: Take 1 tablet by mouth 3 (three) times a week.  03/08/14  Yes Raeford Razor, MD  Tiotropium Bromide-Olodaterol 2.5-2.5 MCG/ACT AERS Inhale 2 puffs into the lungs every morning. 01/30/17  Yes [provider]  Allergies    Tramadol  Review of Systems   Review of Systems  Constitutional: Negative for diaphoresis, fatigue and fever.  Respiratory: Negative for cough and shortness of breath.   Cardiovascular: Positive for chest pain (right rib pain).  Gastrointestinal: Negative for abdominal pain, nausea and vomiting.  Genitourinary: Negative for dysuria, flank pain and hematuria.  Musculoskeletal: Positive for back pain. Negative for neck pain.  Neurological: Negative for weakness, numbness and headaches.    Physical Exam Updated Vital Signs BP (!) 143/85 (BP Location: Right Arm)   Pulse 97   Temp 98.1 F (36.7 C) (Oral)   Resp 20    Ht 5\' 10"  (1.778 m)   Wt 86.2 kg   SpO2 97%   BMI 27.26 kg/m   Physical Exam Vitals and nursing note reviewed.  Constitutional:      General: He is not in acute distress.    Appearance: He is well-developed.  HENT:     Head: Normocephalic and atraumatic.  Cardiovascular:     Rate and Rhythm: Normal rate and regular rhythm.     Comments: DP pulses are strong and palpable bilaterally Pulmonary:     Effort: Pulmonary effort is normal. No respiratory distress.     Breath sounds: Normal breath sounds.  Chest:     Chest wall: Tenderness (focal ttp of the lateral and posterior right ribs.  no ecchymosis or crepitus.  ) present.  Abdominal:     General: There is no distension.     Palpations: Abdomen is soft.     Tenderness: There is no abdominal tenderness.  Musculoskeletal:        General: Tenderness present.     Cervical back: Normal range of motion and neck supple.     Lumbar back: Tenderness present. No swelling, deformity or lacerations. Normal range of motion.     Comments: ttp of the right thoracic paraspinal muscles.  No spinal tenderness.     Skin:    General: Skin is warm and dry.     Capillary Refill: Capillary refill takes less than 2 seconds.     Findings: No rash.  Neurological:     Mental Status: He is alert. Mental status is at baseline.     Sensory: No sensory deficit.     Motor: No abnormal muscle tone.     Coordination: Coordination normal.     Gait: Gait normal.     Deep Tendon Reflexes:     Reflex Scores:      Patellar reflexes are 2+ on the right side and 2+ on the left side.      Achilles reflexes are 2+ on the right side and 2+ on the left side.    ED Results / Procedures / Treatments   Labs (all labs ordered are listed, but only abnormal results are displayed) Labs Reviewed - No data to display  EKG None  Radiology DG Ribs Unilateral W/Chest Right  Result Date: 02/05/2020 CLINICAL DATA:  Status post fall. EXAM: RIGHT RIBS AND CHEST - 3+  VIEW COMPARISON:  None. FINDINGS: Acute fractures are seen involving the sixth and seventh right ribs. A chronic sixth left rib fracture is noted. A chronic deformity of the distal left clavicle is also seen. There is no evidence of pneumothorax or pleural effusion. Both lungs are clear. Heart size and mediastinal contours are within normal limits. IMPRESSION: Acute fractures involving the sixth and seventh right ribs. Electronically Signed   By: 02/07/2020 M.D.   On:  02/05/2020 21:51    Procedures Procedures (including critical care time)  Medications Ordered in ED Medications  naproxen (NAPROSYN) tablet 500 mg (500 mg Oral Given 02/05/20 2330)    ED Course  I have reviewed the triage vital signs and the nursing notes.  Pertinent labs & imaging results that were available during my care of the patient were reviewed by me and considered in my medical decision making (see chart for details).    MDM Rules/Calculators/A&P                         Pt here with right rib pain secondary to a mechanical fall.  He has ttp w/o crepitus or splinting.  Appears uncomfortable. abd is soft and non-tender.  XR shows acute fx's of the 6th and 7th ribs.  No evidence of pneumo and vitals reassuring.     on review of pt's medical records, Creatinine 0.91 on 01/08/20  Pt driving.  Give NSAID here, with short course of percocet.  He appears appropriate for d/c home and agrees to close out pt f/u with PCP.  Strict return precautions given.     Final Clinical Impression(s) / ED Diagnoses Final diagnoses:  Closed fracture of multiple ribs of right side, initial encounter    Rx / DC Orders ED Discharge Orders         Ordered    oxyCODONE-acetaminophen (PERCOCET/ROXICET) 5-325 MG tablet  Every 4 hours PRN     Discontinue  Reprint     02/05/20 2314    oxyCODONE-acetaminophen (PERCOCET/ROXICET) 5-325 MG tablet  Every 4 hours PRN     Discontinue  Reprint     02/05/20 2316           Kem Parkinson,  PA-C 02/08/20 1505    Fredia Sorrow, MD 02/10/20 [phone]

## 2020-02-05 NOTE — ED Notes (Signed)
Pt sitting in chair in the room, pt states that his dog tripped him up and he fell, pt reports some back pain, denies midline tenderness.  Denies numbness or tingling, resps even and unalbored

## 2020-02-05 NOTE — Discharge Instructions (Addendum)
Your x-ray this evening shows 2 broken ribs on the right side.  Try to cough and take deep breaths throughout the day.  Follow-up with your primary care provider for recheck.  Return to the emergency department if you develop any worsening symptoms such as sudden increased pain or shortness of breath.

## 2020-02-05 NOTE — ED Triage Notes (Signed)
Pt tripped over his dog causing him to fall, hitting right side against the coffee table, c/o pain to right rib cage area,

## 2020-02-08 MED FILL — Oxycodone w/ Acetaminophen Tab 5-325 MG: ORAL | Qty: 6 | Status: AC

## 2020-03-05 ENCOUNTER — Emergency Department (HOSPITAL_COMMUNITY): Payer: Medicaid - Out of State

## 2020-03-05 ENCOUNTER — Emergency Department (HOSPITAL_COMMUNITY)
Admission: EM | Admit: 2020-03-05 | Discharge: 2020-03-05 | Disposition: A | Discharge status: Home / Self Care | Payer: Medicaid - Out of State | Attending: Emergency Medicine | Admitting: Emergency Medicine

## 2020-03-05 ENCOUNTER — Encounter (HOSPITAL_COMMUNITY): Payer: Self-pay | Admitting: Emergency Medicine

## 2020-03-05 DIAGNOSIS — W19XXXA Unspecified fall, initial encounter: Secondary | ICD-10-CM

## 2020-03-05 DIAGNOSIS — Z87891 Personal history of nicotine dependence: Secondary | ICD-10-CM | POA: Diagnosis not present

## 2020-03-05 DIAGNOSIS — Y9289 Other specified places as the place of occurrence of the external cause: Secondary | ICD-10-CM | POA: Diagnosis not present

## 2020-03-05 DIAGNOSIS — Y9389 Activity, other specified: Secondary | ICD-10-CM | POA: Insufficient documentation

## 2020-03-05 DIAGNOSIS — W010XXA Fall on same level from slipping, tripping and stumbling without subsequent striking against object, initial encounter: Secondary | ICD-10-CM | POA: Insufficient documentation

## 2020-03-05 DIAGNOSIS — S2231XD Fracture of one rib, right side, subsequent encounter for fracture with routine healing: Secondary | ICD-10-CM | POA: Insufficient documentation

## 2020-03-05 DIAGNOSIS — S299XXA Unspecified injury of thorax, initial encounter: Secondary | ICD-10-CM | POA: Diagnosis present

## 2020-03-05 DIAGNOSIS — S2241XD Multiple fractures of ribs, right side, subsequent encounter for fracture with routine healing: Secondary | ICD-10-CM

## 2020-03-05 DIAGNOSIS — Y999 Unspecified external cause status: Secondary | ICD-10-CM | POA: Diagnosis not present

## 2020-03-05 LAB — COMPREHENSIVE METABOLIC PANEL WITH GFR
ALT: 23 U/L (ref 0–44)
AST: 21 U/L (ref 15–41)
Albumin: 4.5 g/dL (ref 3.5–5.0)
Alkaline Phosphatase: 110 U/L (ref 38–126)
Anion gap: 10 (ref 5–15)
BUN: 17 mg/dL (ref 6–20)
CO2: 23 mmol/L (ref 22–32)
Calcium: 8.9 mg/dL (ref 8.9–10.3)
Chloride: 104 mmol/L (ref 98–111)
Creatinine, Ser: 0.91 mg/dL (ref 0.61–1.24)
GFR calc Af Amer: >60 mL/min
GFR calc non Af Amer: >60 mL/min
Glucose, Bld: 110 mg/dL — ABNORMAL HIGH (ref 70–99)
Potassium: 4.1 mmol/L (ref 3.5–5.1)
Sodium: 137 mmol/L (ref 135–145)
Total Bilirubin: 0.7 mg/dL (ref 0.3–1.2)
Total Protein: 8 g/dL (ref 6.5–8.1)

## 2020-03-05 LAB — CBC WITH DIFFERENTIAL/PLATELET
Abs Immature Granulocytes: 0.06 K/uL (ref 0.00–0.07)
Basophils Absolute: 0.1 K/uL (ref 0.0–0.1)
Basophils Relative: 1 %
Eosinophils Absolute: 0.1 K/uL (ref 0.0–0.5)
Eosinophils Relative: 1 %
HCT: 48.5 % (ref 39.0–52.0)
Hemoglobin: 15.5 g/dL (ref 13.0–17.0)
Immature Granulocytes: 0 %
Lymphocytes Relative: 21 %
Lymphs Abs: 3.2 K/uL (ref 0.7–4.0)
MCH: 28.6 pg (ref 26.0–34.0)
MCHC: 32 g/dL (ref 30.0–36.0)
MCV: 89.5 fL (ref 80.0–100.0)
Monocytes Absolute: 1.8 K/uL — ABNORMAL HIGH (ref 0.1–1.0)
Monocytes Relative: 12 %
Neutro Abs: 10.1 K/uL — ABNORMAL HIGH (ref 1.7–7.7)
Neutrophils Relative %: 65 %
Platelets: 373 K/uL (ref 150–400)
RBC: 5.42 MIL/uL (ref 4.22–5.81)
RDW: 13.8 % (ref 11.5–15.5)
WBC: 15.4 K/uL — ABNORMAL HIGH (ref 4.0–10.5)
nRBC: 0 % (ref 0.0–0.2)

## 2020-03-05 LAB — TROPONIN I (HIGH SENSITIVITY)
Troponin I (High Sensitivity): 3 ng/L (ref <18)
Troponin I (High Sensitivity): 3 ng/L (ref <18)

## 2020-03-05 MED ORDER — FENTANYL CITRATE (PF) 100 MCG/2ML IJ SOLN
50.0000 ug | Freq: Once | INTRAMUSCULAR | Status: AC
  Administered 2020-03-05: 50 ug via INTRAVENOUS
  Filled 2020-03-05: qty 2

## 2020-03-05 MED ORDER — ALBUTEROL SULFATE HFA 108 (90 BASE) MCG/ACT IN AERS: 1.0000 | INHALATION_SPRAY | Freq: Four times a day (QID) | RESPIRATORY_TRACT | 0 refills | Status: DC | PRN

## 2020-03-05 MED ORDER — ONDANSETRON HCL 4 MG/2ML IJ SOLN
4.0000 mg | Freq: Once | INTRAMUSCULAR | Status: AC
  Administered 2020-03-05: 4 mg via INTRAVENOUS
  Filled 2020-03-05: qty 2

## 2020-03-05 MED ORDER — METHYLPREDNISOLONE SODIUM SUCC 125 MG IJ SOLR
125.0000 mg | Freq: Once | INTRAMUSCULAR | Status: AC
  Administered 2020-03-05: 125 mg via INTRAVENOUS
  Filled 2020-03-05: qty 2

## 2020-03-05 MED ORDER — ALBUTEROL SULFATE HFA 108 (90 BASE) MCG/ACT IN AERS
2.0000 | INHALATION_SPRAY | Freq: Once | RESPIRATORY_TRACT | Status: AC
  Administered 2020-03-05: 2 via RESPIRATORY_TRACT
  Filled 2020-03-05: qty 6.7

## 2020-03-05 MED ORDER — OXYCODONE-ACETAMINOPHEN 5-325 MG PO TABS: 1.0000 | ORAL_TABLET | Freq: Four times a day (QID) | ORAL | 0 refills | Status: DC | PRN

## 2020-03-05 MED ORDER — MORPHINE SULFATE (PF) 4 MG/ML IV SOLN
4.0000 mg | Freq: Once | INTRAVENOUS | Status: AC
  Administered 2020-03-05: 4 mg via INTRAVENOUS
  Filled 2020-03-05: qty 1

## 2020-03-05 NOTE — ED Triage Notes (Signed)
Pt feel 3 weeks ago over his dog and cracked ribs 6 and 7. Last night, he fell again on the same side. Is having right sided rib pain and feels he has punctured a lung. O2 sats 95% on RA.

## 2020-03-05 NOTE — ED Notes (Signed)
Pt reports he sees Dr Thersa Salt in [place]   She is out of town "and when I went to her office they didn't do anything"  He also reports he was on Xanax- failed a drug test "when my daughter stole them and there wasn't any in my system" And his physician stopped his prescription- "How could she do that?"  He was also on lexapro and "they cut that in half- I can't even sign my name cause I shake so much"

## 2020-03-05 NOTE — ED Provider Notes (Signed)
[facility] EMERGENCY DEPARTMENT Provider Note   CSN: [id number] Arrival date & time: 03/05/20  1022     History Chief Complaint  Patient presents with  . Fall    Sean Young is a 59 y.o. male positive history anxiety, bipolar 1 who presents for evaluation of right-sided chest pain, difficulty breathing that began last night.  He reports that 3 weeks ago (02/05/20) he tripped over his dog and fell.  He came to the ED and found to have fracture of ribs 6 and rib 7. He was discharged home on pain medication had been doing well since. He states that last night, he fell again on the same side. He reports that immediately after, he had some pain to that right side of his chest wall. Throughout the course of the night, it got progressively worsened until this morning. He states that every time he moves or presses on the area it hurts. He feels like "something is poking into his lung on the right side." He states that the pain is on the right lateral chest wall and goes around to his back. He states that he does have a history of COPD and does feel he has been wheezing.  He uses inhalers last night with some improvement in the breathing but states that he is still continued to have pain. He has not taken a medication for the pain. He denies any left-sided chest pain.  He has had some nausea and diaphoresis.  Denies any vomiting.  Denies any abdominal pain. Denies any fevers or cough.  The history is provided by the patient.       Past Medical History:  Diagnosis Date  . Anxiety   . Bipolar 1 disorder (HCC)     There are no problems to display for this patient.   Past Surgical History:  Procedure Laterality Date  . ANKLE SURGERY    . CHOLECYSTECTOMY    . FINGER SURGERY         No family history on file.  Social History   Tobacco Use  . Smoking status: Former Smoker    Quit date: 03/08/2013    Years since quitting: 6.9  . Smokeless tobacco: Former Engineer, water Use Topics    . Alcohol use: Yes    Comment: Occ  . Drug use: No    Home Medications Prior to Admission medications   Medication Sig Start Date End Date Taking? Authorizing Provider  albuterol (VENTOLIN HFA) 108 (90 Base) MCG/ACT inhaler Inhale 1-2 puffs into the lungs every 6 (six) hours as needed for wheezing or shortness of breath. 03/05/20   Maxwell Caul, PA-C  amitriptyline (ELAVIL) 100 MG tablet Take 100 mg by mouth at bedtime.  01/08/20   [provider]  budesonide (PULMICORT) 0.5 MG/2ML nebulizer solution Inhale 2 mLs into the lungs 2 (two) times daily as needed (for shortness of breath).  12/23/19   [provider]  fexofenadine (ALLEGRA) 180 MG tablet Take 180 mg by mouth daily.  01/25/20   [provider]  fluticasone (FLONASE) 50 MCG/ACT nasal spray Place 2 sprays into the nose daily as needed for allergies or rhinitis.  11/04/19   [provider]  ipratropium (ATROVENT) 0.02 % nebulizer solution Inhale 2.5 mLs into the lungs 2 (two) times daily as needed for wheezing or shortness of breath.  01/25/20   [provider]  oxyCODONE-acetaminophen (PERCOCET/ROXICET) 5-325 MG tablet Take 1-2 tablets by mouth every 6 (six) hours as needed  for severe pain. 03/05/20   Maxwell Caul, PA-C  pantoprazole (PROTONIX) 40 MG tablet Take 40 mg by mouth daily.  01/08/20   [provider]  predniSONE (DELTASONE) 20 MG tablet Take 20 mg by mouth daily with breakfast.  10/12/19   [provider]  sulfamethoxazole-trimethoprim (SEPTRA DS) 800-160 MG per tablet Take 1 tablet by mouth every 12 (twelve) hours. Patient taking differently: Take 1 tablet by mouth 3 (three) times a week.  03/08/14   Raeford Razor, MD  Tiotropium Bromide-Olodaterol 2.5-2.5 MCG/ACT AERS Inhale 2 puffs into the lungs every morning. 01/30/17   [provider]    Allergies    Tramadol  Review of Systems   Review of Systems  Constitutional: Negative for fever.   Respiratory: Positive for shortness of breath and wheezing. Negative for cough.   Cardiovascular: Positive for chest pain.  Gastrointestinal: Negative for abdominal pain, nausea and vomiting.  Genitourinary: Negative for dysuria and hematuria.  Neurological: Negative for headaches.  All other systems reviewed and are negative.   Physical Exam Updated Vital Signs BP (!) 130/92   Pulse 89   Resp 19   SpO2 96%   Physical Exam Vitals and nursing note reviewed.  Constitutional:      Appearance: Normal appearance. He is well-developed.     Comments: Appears uncomfortable.  HENT:     Head: Normocephalic and atraumatic.  Eyes:     General: Lids are normal.     Conjunctiva/sclera: Conjunctivae normal.     Pupils: Pupils are equal, round, and reactive to light.  Cardiovascular:     Rate and Rhythm: Regular rhythm. Tachycardia present.     Pulses: Normal pulses.          Radial pulses are 2+ on the right side and 2+ on the left side.       Dorsalis pedis pulses are 2+ on the right side and 2+ on the left side.     Heart sounds: Normal heart sounds. No murmur heard.  No friction rub. No gallop.   Pulmonary:     Effort: Pulmonary effort is normal. Tachypnea present.     Breath sounds: Wheezing present.     Comments: Tachypneic. Evidence of wheezing noted diffusely. Poor effort given pain but appears to have equal breath sounds. No evidence of respiratory distress. Chest:       Comments: Point tenderness noted to the right anterior lateral chest wall. He states that this tenderness wraps around to the posterior chest wall as well. No flail chest. Abdominal:     Palpations: Abdomen is soft. Abdomen is not rigid.     Tenderness: There is no abdominal tenderness. There is no guarding.     Comments: Abdomen is soft, non-distended, non-tender. No rigidity, No guarding. No peritoneal signs.  Musculoskeletal:        General: Normal range of motion.     Cervical back: Full passive range of  motion without pain.  Skin:    General: Skin is warm and moist.     Capillary Refill: Capillary refill takes less than 2 seconds.  Neurological:     Mental Status: He is alert and oriented to person, place, and time.  Psychiatric:        Speech: Speech normal.       ED Results / Procedures / Treatments   Labs (all labs ordered are listed, but only abnormal results are displayed) Labs Reviewed  COMPREHENSIVE METABOLIC PANEL - Abnormal; Notable for the following components:  Result Value   Glucose, Bld 110 (*)    All other components within normal limits  CBC WITH DIFFERENTIAL/PLATELET - Abnormal; Notable for the following components:   WBC 15.4 (*)    Neutro Abs 10.1 (*)    Monocytes Absolute 1.8 (*)    All other components within normal limits  TROPONIN I (HIGH SENSITIVITY)  TROPONIN I (HIGH SENSITIVITY)    EKG EKG Interpretation  Date/Time:  Saturday March 05 2020 11:01:35 EDT Ventricular Rate:  111 PR Interval:    QRS Duration: 92 QT Interval:  325 QTC Calculation: 442 R Axis:   80 Text Interpretation: Sinus tachycardia Multiform ventricular premature complexes Aberrant conduction of SV complex(es) Abnormal R-wave progression, early transition Nonspecific T abnormalities, lateral leads Confirmed by Bethann Berkshire [phone]) on 03/06/2020 7:38:42 AM   Radiology DG Chest Port 1 View  Result Date: 03/05/2020 CLINICAL DATA:  59 year old male with history of trauma from a fall last week with right-sided rib fractures. Recurrent fall yesterday. Diaphoresis and shortness of breath. EXAM: PORTABLE CHEST 1 VIEW COMPARISON:  Chest x-ray 02/05/2020. FINDINGS: Old mildly displaced fractures of the posterolateral aspect of the right sixth and seventh ribs again noted with some adjacent pleural thickening. Old healed left posterolateral left sixth rib fracture, similar to prior examination. Lung volumes are normal. No consolidative airspace disease. No pleural effusions. No  pneumothorax. No pulmonary nodule or mass noted. Pulmonary vasculature and the cardiomediastinal silhouette are within normal limits. IMPRESSION: 1. Old right-sided sixth and seventh rib fractures redemonstrated with some adjacent pleural thickening. No pneumothorax or other acute findings. Electronically Signed   By: Trudie Reed M.D.   On: 03/05/2020 11:11    Procedures Procedures (including critical care time)  Medications Ordered in ED Medications  ondansetron (ZOFRAN) injection 4 mg (4 mg Intravenous Given 03/05/20 1053)  morphine 4 MG/ML injection 4 mg (4 mg Intravenous Given 03/05/20 1052)  methylPREDNISolone sodium succinate (SOLU-MEDROL) 125 mg/2 mL injection 125 mg (125 mg Intravenous Given 03/05/20 1130)  fentaNYL (SUBLIMAZE) injection 50 mcg (50 mcg Intravenous Given 03/05/20 1301)  albuterol (VENTOLIN HFA) 108 (90 Base) MCG/ACT inhaler 2 puff (2 puffs Inhalation Given 03/05/20 1535)    ED Course  I have reviewed the triage vital signs and the nursing notes.  Pertinent labs & imaging results that were available during my care of the patient were reviewed by me and considered in my medical decision making (see chart for details).    MDM Rules/Calculators/A&P                          59 year old male who presents for evaluation of right sided chest pain and difficulty breathing. History of rib fractures on the right side 3 weeks ago. He sustained another fall last night and landed on his right chest wall. Had pain to that area and states that since then, it is been progressively worsening. He does feel like he has wheezing and took his albuterol inhalers at home with some improvement in his breathing but states that he still has the pain. On initially arrival, he is tachycardic, tachypneic, hypertensive. He does appear uncomfortable. This is likely secondary to pain. He is slightly diaphoretic but otherwise looks stable. On exam, he has point tenderness noted to the right  anterior/lateral and posterior chest wall. No flail chest. On exam, no evidence of respiratory distress but he does have some wheezing. He is giving poor effort secondary to pain. He has equal pulses in  all four extremities. I suspect this is most likely pain associated with trauma. There may be some mixed COPD exacerbation noted as he does have some wheezing. Consider pneumothorax given history of rib fractures. Also consider ACS etiology though this sounds atypical. This happened after trauma and he has point tenderness over the right lateral chest wall and back. Additionally, do not suspect dissection as he has point tenderness where his trauma was related. He has equal pulses in all four extremities. We will plan to check chest x-ray for evaluation of pneumothorax, cardiac labs to ensure that there is no acute abnormality. Plan for pain control, steroids.  Chest x-ray reviewed. No evidence of pneumothorax. No evidence of new rib fractures.  CBC does show slight leukocytosis of 15.4. CMP is unremarkable. Initial troponin is negative.  Reevaluation. Patient is resting more comfortably. He is still having some pain is requesting more pain medication. He does not appear diaphoretic or in evidence of acute distress. Reevaluation shows equal pulses in all four extremities. Additionally, his wheezing is improved after steroids. At this time, his exam is not concerning for dissection. I suspect this was a combo of COPD exacerbation and pain control. Will give additional analgesics. We will plan to repeat delta troponin to ensure that this is not acute cardiology.  Delta troponin negative.  Discussed with Dr. Estell HarpinZammit who evaluated patient. Patient did mention to nurse that he was out of his Xanax. Instructed patient that he will need to follow-up with his primary care doctor. I will give him a short course of pain medication. His most recent narcotic prescription was during his ED visit here in 02/05/2020. At  this time, vitals are stable. No evidence of hypoxia. Patient appears much more comfortable. Patient instructed on incentive spirometer use. At this time, patient exhibits no emergent life-threatening condition that require further evaluation in ED or admission. Discussed patient with Dr. Estell HarpinZammit who is agreeable. Patient had ample opportunity for questions and discussion. All patient's questions were answered with full understanding. Strict return precautions discussed. Patient expresses understanding and agreement to plan.   Portions of this note were generated with Scientist, clinical (histocompatibility and immunogenetics)Dragon dictation software. Dictation errors may occur despite best attempts at proofreading.  Final Clinical Impression(s) / ED Diagnoses Final diagnoses:  Fall, initial encounter  Closed fracture of multiple ribs of right side with routine healing, subsequent encounter    Rx / DC Orders ED Discharge Orders         Ordered    albuterol (VENTOLIN HFA) 108 (90 Base) MCG/ACT inhaler  Every 6 hours PRN     Discontinue  Reprint     03/05/20 1525    oxyCODONE-acetaminophen (PERCOCET/ROXICET) 5-325 MG tablet  Every 6 hours PRN     Discontinue  Reprint     03/05/20 1525           Rosana HoesLayden, Trenna Kiely A, PA-C 03/06/20 2205    Bethann BerkshireZammit, Joseph, MD 03/07/20 [id number]

## 2020-03-05 NOTE — Discharge Instructions (Addendum)
You can take Tylenol or Ibuprofen as directed for pain. You can alternate Tylenol and Ibuprofen every 4 hours. If you take Tylenol at 1pm, then you can take Ibuprofen at 5pm. Then you can take Tylenol again at 9pm.   Take pain medications as directed for break through pain. Do not drive or operate machinery while taking this medication.   As we discussed, use the incentive spirometer as directed.  Take albuterol inhaler as needed.  Follow-up with your primary care doctor.  Return to emergency department for worsening pain, difficulty breathing or any other worsening concerning symptoms.

## 2020-03-05 NOTE — ED Notes (Signed)
Pt reports tripping over his dog and falling   Has previous rib fx's  Denies drug or alcohol use   Diaphoretic, anxious and asking for pain meds

## 2022-02-01 IMAGING — DX DG RIBS W/ CHEST 3+V*R*
5 series · 5 of 5 positions shown · non-contrast
Comparison: None.

CLINICAL DATA: Status post fall.

EXAM:
RIGHT RIBS AND CHEST - 3+ VIEW

[chest pa]
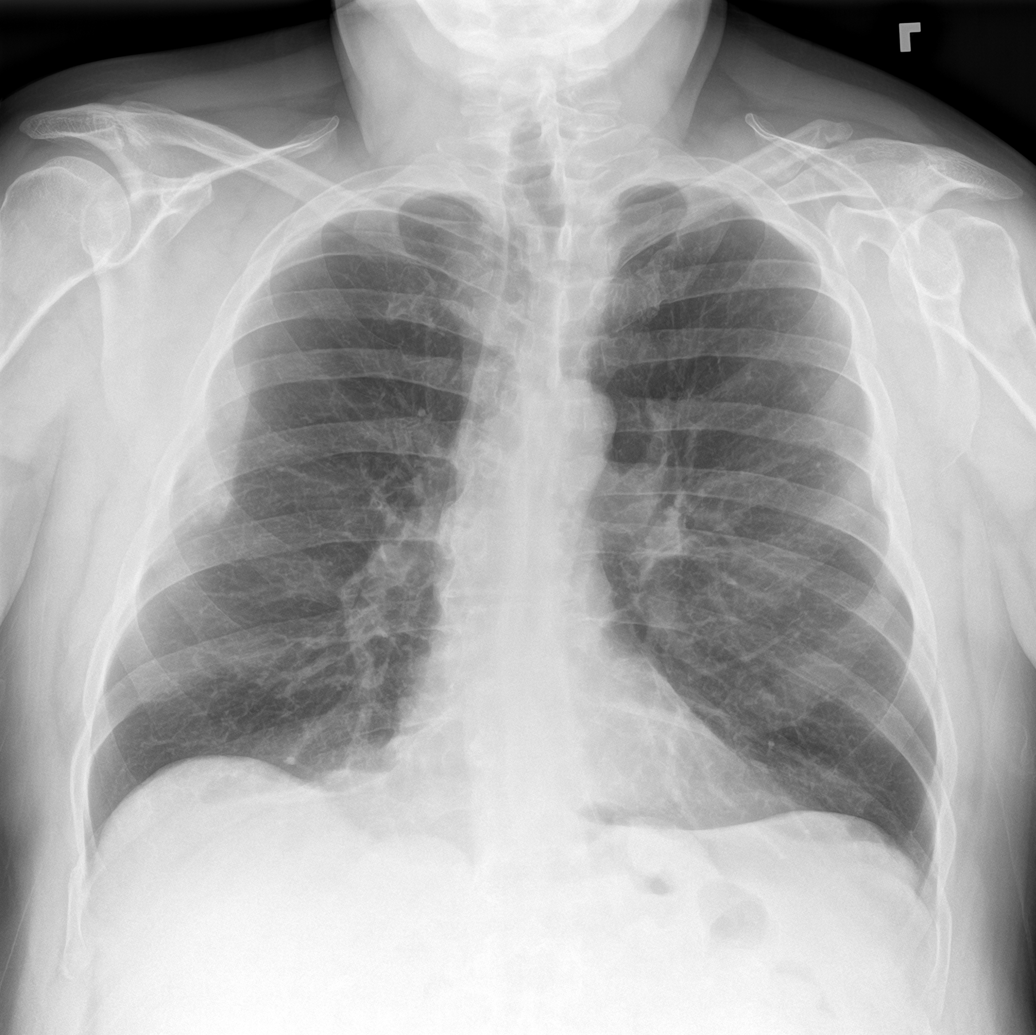

[rib pa (1 of 2)]
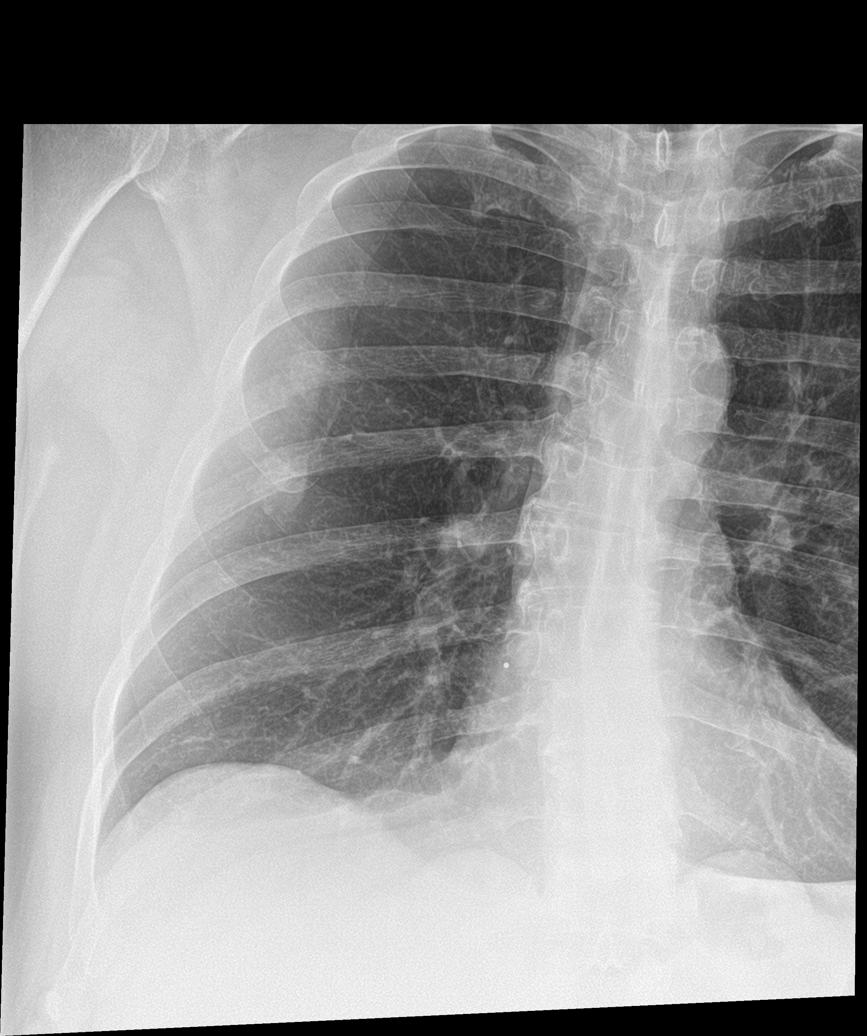

[rib pa obl (1 of 2)]
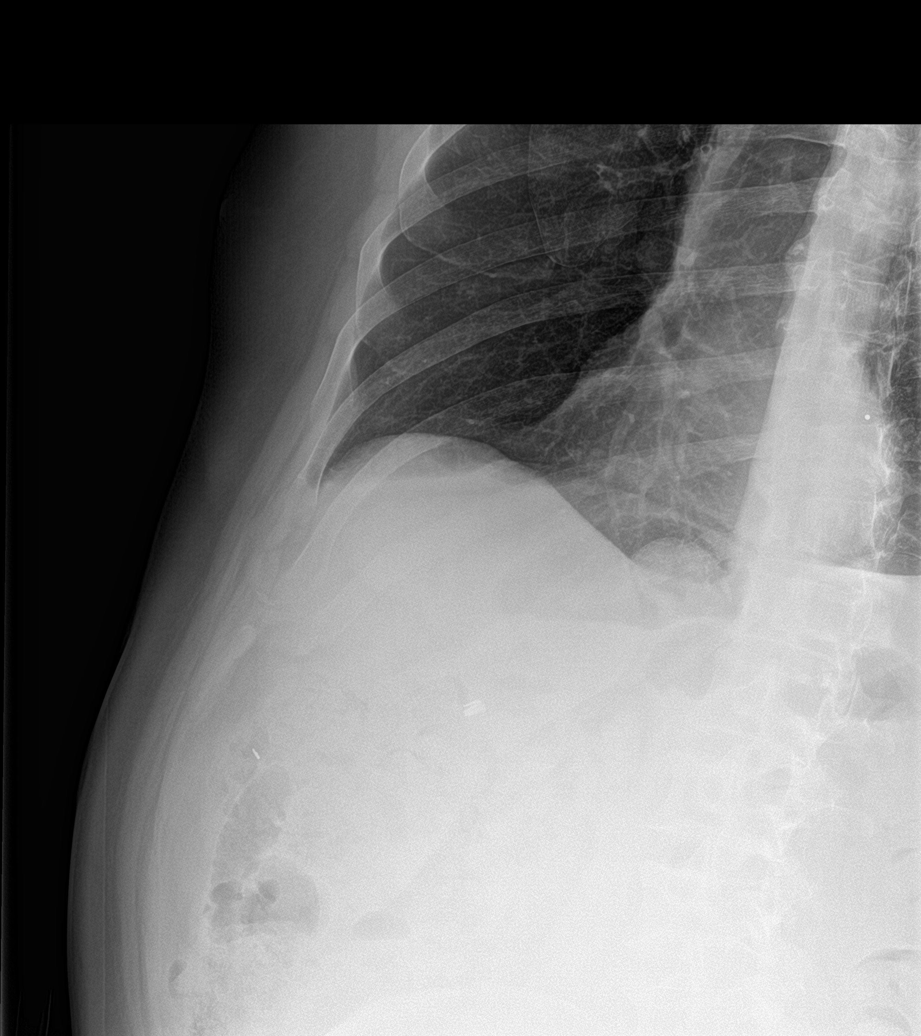

[rib pa obl (2 of 2)]
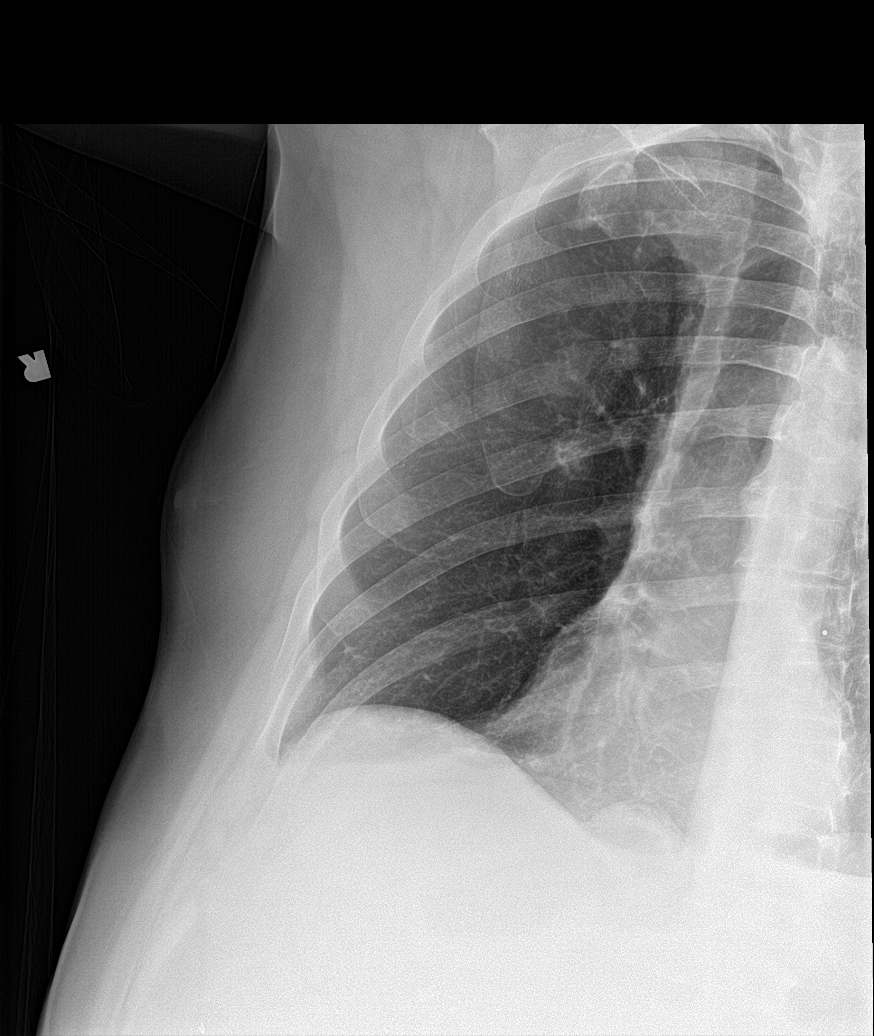

[rib pa (2 of 2)]
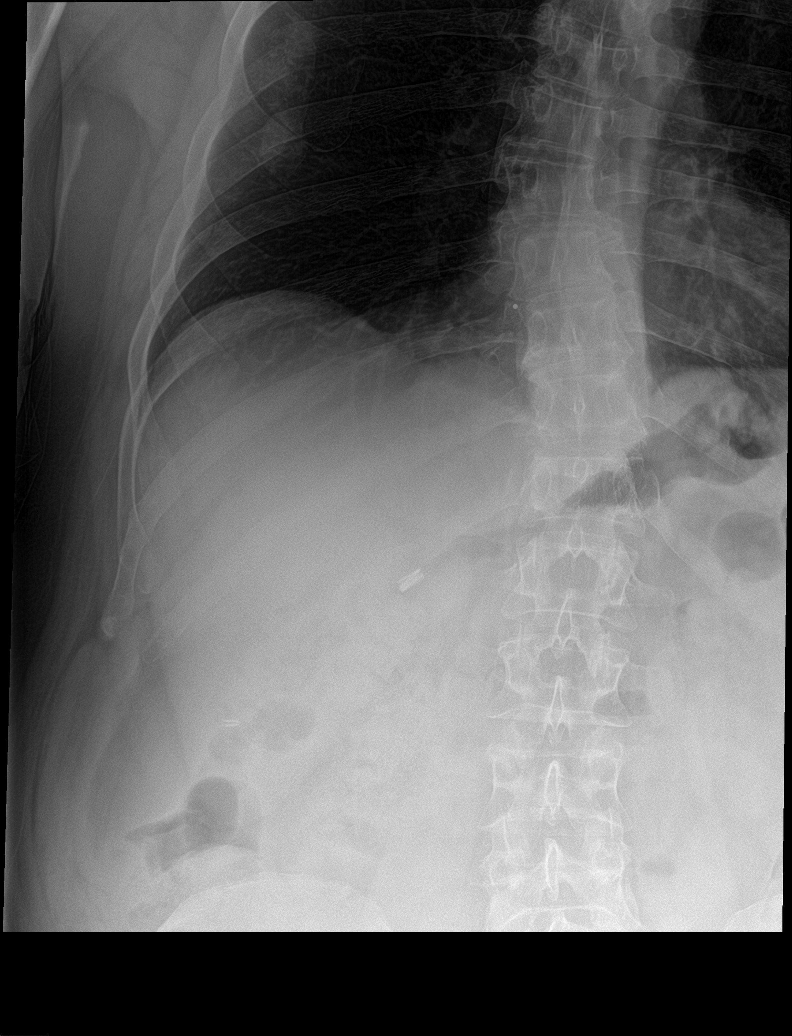

[5 of 5 positions shown; findings below may reference images not displayed]

FINDINGS: Acute fractures are seen involving the sixth and seventh right ribs.
A chronic sixth left rib fracture is noted. A chronic deformity of
the distal left clavicle is also seen. There is no evidence of
pneumothorax or pleural effusion. Both lungs are clear. Heart size
and mediastinal contours are within normal limits.
IMPRESSION: Acute fractures involving the sixth and seventh right ribs.

## 2023-04-29 ENCOUNTER — Telehealth: Payer: Self-pay

## 2023-04-29 NOTE — Telephone Encounter (Signed)
Patient called the [facility] office to receive a list of PCP providers in the area. I recommended the patient to call [facility] due to convenience of location and also sent him a curtsey text of providers located in [facility]

## 2024-02-21 ENCOUNTER — Ambulatory Visit: Payer: Self-pay

## 2024-02-21 NOTE — Telephone Encounter (Signed)
 FYI Only or Action Required?: Action required by provider: heading to ED, needs earlier appt than 8/25 if at all possible.  Patient is to be new patient with Pulmonology for emphysema.  Called Nurse Triage reporting Shortness of Breath, Chest Pain, Cough, Dizziness, Fatigue, recent panic attack, and Depression.  Symptoms began a week ago.  Interventions attempted: Prescription medications: prednisone and antibx regularly, Rescue inhaler, Maintenance inhaler, Nebulizer treatments, and Home oxygen use.  Symptoms are: rapidly worsening.  Triage Disposition: Go to ED Now (Notify PCP)  Patient/caregiver understands and will follow disposition?: Yes      Copied from CRM [id number]. Topic: Clinical - Red Word Triage >> Feb 21, 2024  2:41 PM Sean Young wrote: Red Word that prompted transfer to Nurse Triage: SOB/ trouble breathing, pt needs inhaler refills, steroids, abx   Reason for Disposition  [1] Chest pain (or angina) comes and goes AND [2] is happening more often (increasing in frequency) or getting worse (increasing in severity)  (Exception: Chest pains that last only a few seconds.)  Answer Assessment - Initial Assessment Questions Advised pt go to ED today for symptoms, advised set up PCP asap in the area, pt states he will call his current PCP for a recommendation tomorrow rather than schedule today for new PCP appt. Scheduled next available appt with pulm in [place] per pt request, placed on wait list. Sending message to pulm for them to look for earlier appt if possible since extensive pulmonary needs and recently moved into area.   [facility] Triage - Initial Assessment Questions Chief Complaint (e.g., cough, sob, wheezing, fever, chills, sweat or additional symptoms) *Go to specific symptom protocol after initial questions. Worsening SOB than usual, worse here lately In dire need of appt and meds Dizziness with standing in line at [facility] today, feel like going to pass out at  that time Don't know if panic attack or asthma attack sometimes Coughing, cough up phlegm all the time Coughing fits, chest pain with those, chest tightness, not constant Heat has brought on chest pain more than anything Chest pain brief Don't have energy for nothing Don't feel like too weak to stand or walk Know I'm depressed, probably need to see a shrink too Was going to doc in [place] , had been refilling it but no longer going to refill, needs new patient appt asap Takes antibx all the time to keep infection away, lives off the inhalers Had panic attack an hour ago when went to drug store to get my med, family doc in [place] , wrote scripts for inhalers and had taken over pulm scripts, insurance won't cover scripts now since doc in [place] , out of pocket costs in $600s  How long have symptoms been present? 1.5 weeks ago  Have you used your inhalers/maintenance medication? Yes If yes, What medications? Breztri helps so much Prednisone Antibx 3x/week bactrim  Nebulizer albuterol   If inhaler, ask How many puffs and how often? Note: Review instructions on medication in the chart. Lately using 3-4x/day nebulizer, using albuterol  inhaler more so in last few months than usual, get 2/month was getting 3/month then insurance switched, been getting inhalers from people who don't need it as much  OXYGEN: Do you wear supplemental oxygen? Yes If yes, How many liters are you supposed to use? Wasn't prescribed to me, someone gave it to me, they had one and see I needed one, use it sometimes at night, helps me wind down and breathe good enough to go to sleep Sometimes go to sleep and  afraid wouldn't wake up  What is your usual oxygen saturation reading?  (Note: Pulmonary O2 sats should be 90% or greater) Pulm says my oxygen level is fine  6. CARDIAC HISTORY: Do you have any history of heart disease? (e.g., heart attack, angina, bypass surgery, angioplasty)      I really  don't  7. LUNG HISTORY: Do you have any history of lung disease?  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

## 2024-02-24 NOTE — Telephone Encounter (Signed)
 [place], will you please add this pt to the cancellation list if not already done. He was requesting sooner appt and there are none available. Thank you!

## 2024-02-24 NOTE — Telephone Encounter (Signed)
 Fine with me

## 2024-03-23 ENCOUNTER — Encounter: Payer: Self-pay | Admitting: Internal Medicine

## 2024-03-23 ENCOUNTER — Telehealth: Payer: Self-pay | Admitting: Internal Medicine

## 2024-03-23 ENCOUNTER — Ambulatory Visit (INDEPENDENT_AMBULATORY_CARE_PROVIDER_SITE_OTHER): Admitting: Internal Medicine

## 2024-03-23 VITALS — BP 111/62 | HR 106 | Ht 70.0 in | Wt 212.2 lb

## 2024-03-23 DIAGNOSIS — R0609 Other forms of dyspnea: Secondary | ICD-10-CM | POA: Diagnosis not present

## 2024-03-23 DIAGNOSIS — Z87891 Personal history of nicotine dependence: Secondary | ICD-10-CM | POA: Insufficient documentation

## 2024-03-23 MED ORDER — PANTOPRAZOLE SODIUM 40 MG PO TBEC: 40.0000 mg | DELAYED_RELEASE_TABLET | Freq: Every day | ORAL | 5 refills | Status: AC

## 2024-03-23 MED ORDER — BREZTRI AEROSPHERE 160-9-4.8 MCG/ACT IN AERO: 2.0000 | INHALATION_SPRAY | Freq: Two times a day (BID) | RESPIRATORY_TRACT | Status: AC

## 2024-03-23 MED ORDER — BREZTRI AEROSPHERE 160-9-4.8 MCG/ACT IN AERO: INHALATION_SPRAY | RESPIRATORY_TRACT | 11 refills | Status: AC

## 2024-03-23 MED ORDER — SULFAMETHOXAZOLE-TRIMETHOPRIM 800-160 MG PO TABS: 1.0000 | ORAL_TABLET | Freq: Two times a day (BID) | ORAL | 0 refills | Status: DC

## 2024-03-23 MED ORDER — ALBUTEROL SULFATE HFA 108 (90 BASE) MCG/ACT IN AERS: INHALATION_SPRAY | RESPIRATORY_TRACT | 3 refills | Status: DC

## 2024-03-23 MED ORDER — PREDNISONE 20 MG PO TABS: 20.0000 mg | ORAL_TABLET | Freq: Every day | ORAL | 1 refills | Status: DC

## 2024-03-23 MED ORDER — FLUTICASONE PROPIONATE 50 MCG/ACT NA SUSP: 2.0000 | Freq: Every day | NASAL | 5 refills | Status: DC | PRN

## 2024-03-23 NOTE — Assessment & Plan Note (Addendum)
 Quit around 2023 for the last time as reported 03/23/2024   Low-dose CT lung cancer screening is recommended for patients who are 47-64 years of age with a 20+ pack-year history of smoking and who are currently smoking or quit <=15 years ago. No coughing up blood  No unintentional weight loss of > 15 pounds in the last 6 months - pt is eligible for scanning yearly until 2038 / advised  Discussed in detail all the  indications, usual  risks and alternatives  relative to the benefits with patient who agrees to proceed with Rx as outlined.       Each maintenance medication was reviewed in detail including emphasizing most importantly the difference between maintenance and prns and under what circumstances the prns are to be triggered using an action plan format where appropriate.  Total time for H and P, chart review, counseling, reviewing hfa/neb device(s) , directly observing portions of ambulatory 02 saturation study/ and generating customized AVS unique to this office visit / same day charting = 45 min new pt eval

## 2024-03-23 NOTE — Progress Notes (Addendum)
 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   History of Present Illness  03/23/2024  Pulmonary/ 1st office eval/ Sean Young / [place] Office maint on Prednsione x 2025 on Breztri   Chief Complaint  Patient presents with   Shortness of Breath    Coughing / mucus - yellow and thick  Dyspnea:  [employer] x 3 aisle pushing cart / better p saba  Cough: beige mucus  Sleep: on couch leaning on arm and 3 pillows x year  SABA use: using p exertion  02: noe  LDSCT:ordered   No obvious day to day or daytime pattern/variability or assoc excess/ purulent sputum or mucus plugs or hemoptysis or cp or chest tightness, subjective wheeze or overt sinus or hb symptoms.    Also denies any obvious fluctuation of symptoms with weather or environmental changes or other aggravating or alleviating factors except as outlined above   No unusual exposure hx or h/o childhood pna or knowledge of premature birth.  Current Allergies, Complete Past Medical History, Past Surgical History, Family History, and Social History were reviewed in [employer] record.  ROS  The following are not active complaints unless bolded Hoarseness, sore throat, dysphagia, dental problems, itching, sneezing,  nasal congestion or discharge of excess mucus or purulent secretions, ear ache,   fever, chills, sweats, unintended wt loss or wt gain, classically pleuritic or exertional cp,  orthopnea pnd or arm/hand swelling  or leg swelling, presyncope, palpitations, abdominal pain, anorexia, nausea, vomiting, diarrhea  or change in bowel habits or change in bladder habits, change in stools or change in urine, dysuria, hematuria,  rash,  arthralgias, visual complaints, headache, numbness, weakness or ataxia or problems with walking or coordination,  change in mood or  memory.            Outpatient Medications Prior to Visit  Medication Sig Dispense Refill   albuterol  (VENTOLIN  HFA) 108 (90 Base) MCG/ACT inhaler Inhale 1-2 puffs into the lungs every 6 (six) hours as needed for wheezing or shortness of breath. 8 g 0   amitriptyline (ELAVIL) 100 MG tablet Take 100 mg by mouth at bedtime.      budesonide (PULMICORT) 0.5 MG/2ML nebulizer solution Inhale 2 mLs into the lungs 2 (two) times daily as needed (for shortness of breath).      fexofenadine (ALLEGRA) 180 MG tablet Take 180 mg by mouth daily.      fluticasone  (FLONASE ) 50 MCG/ACT nasal spray Place 2 sprays into the nose daily as needed for allergies or rhinitis.      ipratropium (ATROVENT) 0.02 % nebulizer solution Inhale 2.5 mLs into the lungs 2 (two) times daily as needed for wheezing or shortness of breath.      oxyCODONE -acetaminophen  (PERCOCET/ROXICET) 5-325 MG tablet Take 1-2 tablets by mouth every 6 (six) hours as needed for severe pain. 8 tablet 0   pantoprazole  (PROTONIX ) 40 MG tablet Take 40 mg by mouth daily.      predniSONE  (DELTASONE ) 20 MG tablet Take 20 mg by mouth daily with breakfast.      sulfamethoxazole -trimethoprim  (SEPTRA  DS) 800-160 MG per tablet Take 1 tablet by mouth every 12 (  twelve) hours. (Patient taking differently: Take 1 tablet by mouth 3 (three) times a week. 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    Anxiety    Bipolar 1 disorder (HCC)    Hx of cholecystectomy       Objective:     BP 111/62   Pulse (!) 106   Ht 5' 10 (1.778 m)   Wt 212 lb 3.2 oz (96.3 kg)   SpO2 95% Comment: ra  BMI 30.45 kg/m   SpO2: 95 % (ra) mod obese (by BMI) amb hoarse wm nad / freq violent throat  clearing  HEENT :  Oropharynx  clear   Nasal turbinates nl    NECK :  without JVD/Nodes/TM/ nl carotid upstrokes bilaterally   LUNGS: no acc muscle use,  Mod barrel  contour chest wall with bilateral  Distant bs s audible wheeze and  without cough on insp or exp maneuvers and mod  Hyperresonant  to  percussion bilaterally     CV:  RRR  no s3 or murmur or increase in P2, and no edema   ABDquite obese   soft and nontender with pos mid insp Hoover's  in the supine position. No bruits or organomegaly appreciated, bowel sounds nl  MS:   Ext warm without deformities or   obvious joint restrictions , calf tenderness, cyanosis or clubbing  SKIN: warm and dry without lesions    NEURO:  alert, approp, nl sensorium with  no motor or cerebellar deficits apparent.              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 2) If the pt is convinced, as many are, that saba helps recover from activity faster then it's easy to tell if this is the case by re-challenging : ie stop, take the inhaler, then p 5 minutes try the exact same activity  (intensity of workload) that just caused the symptoms and see if they are substantially diminished or not after saba 3) if there is an activity that reproducibly causes the symptoms, try the saba 15 min before the activity on alternate days   If in fact the saba really does help, then fine to continue to use it prn but advised  may need to look closer at the maintenance regimen being used to achieve better control of airways disease with exertion.   >>> f/u with pfts in 6 weeks and simplify rx (d/c budesonide nebs as not taking anyway)  >>> Depomedrol 120 mg IM (as had this safely before)  >>> For cough/ congestion > mucinex or mucinex dm  up to maximum of  1200 mg every 12 hours and use the flutter valve as much as possbile       Former cigarette smoker Quit around 2023 for the last time as reported 03/23/2024   Low-dose CT lung cancer screening is recommended for patients who are 34-34 years of age with a 20+ pack-year history of smoking and who are currently smoking or quit <=15 years ago. No coughing up blood  No unintentional weight loss of > 15 pounds in the last 6 months - pt is eligible for scanning yearly until 2038 / advised  Discussed in detail all the  indications, usual  risks and alternatives  relative to the benefits with patient who agrees to proceed with Rx as outlined.       Each maintenance medication was reviewed in detail including emphasizing most importantly the difference between maintenance and prns and under what circumstances the prns are to be triggered using an action plan format where appropriate.  Total time for H and P, chart review, counseling, reviewing hfa/neb device(s) , directly observing portions of ambulatory 02 saturation study/ and generating customized AVS unique to this office visit / same day charting = 45 min new pt eval                   Patient Instructions  My office will be contacting you by phone for referral to lung cancer screening    [phone]) - if you don't hear back from my office within one week,  please call us  back or notify us  thru MyChart and we'll address it right away.     For cough/ congestion > mucinex or mucinex dm  up to maximum of  1200 mg every 12 hours and use the flutter valve as much as you can     Plan A = Automatic = Always=    Breztri  Take 2 puffs first thing in am and then another 2 puffs about 12 hours later.    Work on inhaler technique:  relax and gently blow all the way out then take a nice smooth full deep breath back in, triggering the inhaler at same time you start breathing in.  Hold breath in for at least  5 seconds if you can. Blow out breztri   thru nose. Rinse and gargle with water when done.  If mouth or throat bother you at all,  try brushing teeth/gums/tongue with arm and hammer toothpaste/ make a slurry and gargle and spit out.      Plan B = Backup (to supplement plan A, not to replace it) Use your albuterol  inhaler as a rescue medication to be used if you can't catch your breath by resting or slowing your pace  or doing a relaxed purse lip breathing pattern.  - The less you use it, the better it will work when you need it. - Ok to use the inhaler up to 2 puffs  every 4 hours if you must but call for appointment if use goes up over your usual need - Don't leave home without it !!  (think of it like the spare tire or  starter fluid for your car)   Plan C = Crisis (instead of Plan B but only if Plan B stops working) - only use your albuterol  nebulizer if you first try Plan B and it fails to help > ok to use the nebulizer up to every 4 hours but if start needing it regularly call for immediate appointment   Pantoprazole  (protonix ) 40 mg   Take  30-60 min before first meal of the day and Pepcid (famotidine)  20 mg after supper until return to office - this is the best way to tell whether stomach acid is contributing to your problem.    Sugarless jolly ranchers or life savers to keep  from clearing throat - no mints  Please schedule a follow up office visit in 6 weeks, call sooner if needed with all medications /inhalers/ solutions in hand so we can verify exactly what you are taking. This includes all medications from all doctors and over the counters with PFTs on return      Ozell America, MD 03/23/2024

## 2024-03-23 NOTE — Addendum Note (Signed)
 Addended by: RUFFUS LUKES T on: 03/23/2024 04:53 PM   Modules accepted: Orders

## 2024-03-23 NOTE — Assessment & Plan Note (Addendum)
 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 2) If the pt is convinced, as many are, that saba helps recover from activity faster then it's easy to tell if this is the case by re-challenging : ie stop, take the inhaler, then p 5 minutes try the exact same activity (intensity of workload) that just caused the symptoms and see if they are substantially diminished or not after saba 3) if there is an activity that reproducibly causes the symptoms, try the saba 15 min before the activity on alternate days   If in fact the saba really does help, then fine to continue to use it prn but advised may need to look closer at the maintenance regimen being used to achieve better control of airways disease with exertion.   >>> f/u with pfts in 6 weeks and simplify rx (d/c budesonide nebs as not taking anyway)  >>> Depomedrol 120 mg IM (as had this safely before)  >>> For cough/ congestion > mucinex or mucinex dm  up to maximum of  1200 mg every 12 hours and use the flutter valve as much as possbile

## 2024-03-23 NOTE — Telephone Encounter (Signed)
 LVM for patient to call and discuss scheduling the 6 week follow up with Dr. Darlean and the PFT that was ordered

## 2024-03-23 NOTE — Patient Instructions (Addendum)
 My office will be contacting you by phone for referral to lung cancer screening   [phone]) - if you don't hear back from my office within one week,  please call us  back or notify us  thru MyChart and we'll address it right away.     For cough/ congestion > mucinex or mucinex dm  up to maximum of  1200 mg every 12 hours and use the flutter valve as much as you can     Plan A = Automatic = Always=    Breztri  Take 2 puffs first thing in am and then another 2 puffs about 12 hours later.    Work on inhaler technique:  relax and gently blow all the way out then take a nice smooth full deep breath back in, triggering the inhaler at same time you start breathing in.  Hold breath in for at least  5 seconds if you can. Blow out breztri   thru nose. Rinse and gargle with water when done.  If mouth or throat bother you at all,  try brushing teeth/gums/tongue with arm and hammer toothpaste/ make a slurry and gargle and spit out.      Plan B = Backup (to supplement plan A, not to replace it) Use your albuterol  inhaler as a rescue medication to be used if you can't catch your breath by resting or slowing your pace  or doing a relaxed purse lip breathing pattern.  - The less you use it, the better it will work when you need it. - Ok to use the inhaler up to 2 puffs  every 4 hours if you must but call for appointment if use goes up over your usual need - Don't leave home without it !!  (think of it like the spare tire or starter fluid for your car)   Plan C = Crisis (instead of Plan B but only if Plan B stops working) - only use your albuterol  nebulizer if you first try Plan B and it fails to help > ok to use the nebulizer up to every 4 hours but if start needing it regularly call for immediate appointment   Pantoprazole  (protonix ) 40 mg   Take  30-60 min before first meal of the day and Pepcid (famotidine)  20 mg after supper until return to office - this is the best way to tell whether stomach acid is  contributing to your problem.    Sugarless jolly ranchers or life savers to keep from clearing throat - no mints  Please schedule a follow up office visit in 6 weeks, call sooner if needed with all medications /inhalers/ solutions in hand so we can verify exactly what you are taking. This includes all medications from all doctors and over the counters with PFTs on return

## 2024-03-24 NOTE — Telephone Encounter (Signed)
 Spoke with patient regarding the 05/07/24 1:00pm PFT appointment at [facility] time is 12:45 pm --1st floor registration desk for check in---follow up with Dr. Darlean is 05/07/24 at 2:30 pm---will mail information to patient and he voiced his understanding

## 2024-04-01 ENCOUNTER — Other Ambulatory Visit: Payer: Self-pay | Admitting: *Deleted

## 2024-04-01 ENCOUNTER — Telehealth: Payer: Self-pay | Admitting: *Deleted

## 2024-04-01 DIAGNOSIS — Z87891 Personal history of nicotine dependence: Secondary | ICD-10-CM

## 2024-04-01 DIAGNOSIS — Z122 Encounter for screening for malignant neoplasm of respiratory organs: Secondary | ICD-10-CM

## 2024-04-01 NOTE — Telephone Encounter (Signed)
 Lung Cancer Screening Narrative/Criteria Questionnaire (Cigarette Smokers Only- No Cigars/Pipes/vapes)   Sean Young   SDMV:04/08/24 11:00- Natalie                                           January 18, 1961              LDCT: 04/16/24 12:00 AP    62 y.o.   Phone: [phone]  Lung Screening Narrative (confirm age 42-77 yrs Medicare / 50-80 yrs Private pay insurance)   Insurance information:[employer] MCD   Referring Provider:Wert   This screening involves an initial phone call with a team member from our program. It is called a shared decision making visit. The initial meeting is required by insurance and Medicare to make sure you understand the program. This appointment takes about 15-20 minutes to complete. The CT scan will completed at a separate date/time. This scan takes about 5-10 minutes to complete and you may eat and drink before and after the scan.  Criteria questions for Lung Cancer Screening:   Are you a current or former smoker? Former Age began smoking: 14   If you are a former smoker, what year did you quit smoking? 2023 (within 15 yrs)   To calculate your smoking history, I need an accurate estimate of how many packs of cigarettes you smoked per day and for how many years. (Not just the number of PPD you are now smoking)   Years smoking 46 x Packs per day 1 = Pack years 45   (at least 20 pack yrs)   (Make sure they understand that we need to know how much they have smoked in the past, not just the number of PPD they are smoking now)  Do you have a personal history of cancer?  No    Do you have a family history of cancer? No  Are you coughing up blood?  No  Have you had unexplained weight loss of 15 lbs or more in the last 6 months? No  It looks like you meet all criteria.     Additional information: N/A

## 2024-04-06 ENCOUNTER — Ambulatory Visit: Admitting: Internal Medicine

## 2024-04-08 ENCOUNTER — Encounter

## 2024-04-16 ENCOUNTER — Ambulatory Visit (HOSPITAL_COMMUNITY)

## 2024-04-20 ENCOUNTER — Encounter: Payer: Self-pay | Admitting: Adult Health

## 2024-04-20 ENCOUNTER — Ambulatory Visit (INDEPENDENT_AMBULATORY_CARE_PROVIDER_SITE_OTHER): Admitting: Adult Health

## 2024-04-20 DIAGNOSIS — Z87891 Personal history of nicotine dependence: Secondary | ICD-10-CM

## 2024-04-20 NOTE — Progress Notes (Signed)
  Virtual Visit via Telephone Note  I connected with Sean Young , 04/20/24 11:41 AM by a telemedicine application and verified that I am speaking with the correct person using two identifiers.  Location: Patient: home Provider: home   I discussed the limitations of evaluation and management by telemedicine and the availability of in person appointments. The patient expressed understanding and agreed to proceed.   Shared Decision Making Visit Lung Cancer Screening Program [phone])   Eligibility: 63 y.o. Pack Years Smoking History Calculation = 46 pack years  (# packs/per year x # years smoked) Recent History of coughing up blood  no Unexplained weight loss? no ( >Than 15 pounds within the last 6 months ) Prior History Lung / other cancer no (Diagnosis within the last 5 years already requiring surveillance chest CT Scans). Smoking Status Former Smoker Former Smokers: Years since quit: 2 years  Quit Date: 2023  Visit Components: Discussion included one or more decision making aids. YES Discussion included risk/benefits of screening. YES Discussion included potential follow up diagnostic testing for abnormal scans. YES Discussion included meaning and risk of over diagnosis. YES Discussion included meaning and risk of False Positives. YES Discussion included meaning of total radiation exposure. YES  Counseling Included: Importance of adherence to annual lung cancer LDCT screening. YES Impact of comorbidities on ability to participate in the program. YES Ability and willingness to under diagnostic treatment. YES  Smoking Cessation Counseling: Former Smokers:  Discussed the importance of maintaining cigarette abstinence. yes Diagnosis Code: Personal History of Nicotine Dependence. S12.108 Information about tobacco cessation classes and interventions provided to patient. Yes Patient provided with ticket for LDCT Scan. yes Written Order for Lung Cancer Screening with LDCT  placed in Epic. Yes (CT Chest Lung Cancer Screening Low Dose W/O CM) [id number]  Z12.2-Screening of respiratory organs Z87.891-Personal history of nicotine dependence   Sean Young 04/20/24

## 2024-04-20 NOTE — Patient Instructions (Signed)

## 2024-04-22 ENCOUNTER — Ambulatory Visit (HOSPITAL_COMMUNITY)
Admission: RE | Admit: 2024-04-22 | Discharge: 2024-04-22 | Disposition: A | Discharge status: Home / Self Care | Source: Ambulatory Visit | Attending: Acute Care | Admitting: Acute Care

## 2024-04-22 DIAGNOSIS — Z87891 Personal history of nicotine dependence: Secondary | ICD-10-CM | POA: Insufficient documentation

## 2024-04-22 DIAGNOSIS — Z122 Encounter for screening for malignant neoplasm of respiratory organs: Secondary | ICD-10-CM | POA: Insufficient documentation

## 2024-04-24 ENCOUNTER — Encounter (INDEPENDENT_AMBULATORY_CARE_PROVIDER_SITE_OTHER): Payer: Self-pay | Admitting: Otolaryngology

## 2024-04-24 ENCOUNTER — Ambulatory Visit (INDEPENDENT_AMBULATORY_CARE_PROVIDER_SITE_OTHER): Admitting: Otolaryngology

## 2024-04-24 VITALS — BP 124/78 | HR 78

## 2024-04-24 DIAGNOSIS — R0981 Nasal congestion: Secondary | ICD-10-CM

## 2024-04-24 DIAGNOSIS — R0982 Postnasal drip: Secondary | ICD-10-CM | POA: Diagnosis not present

## 2024-04-24 DIAGNOSIS — J3089 Other allergic rhinitis: Secondary | ICD-10-CM

## 2024-04-24 DIAGNOSIS — Z87891 Personal history of nicotine dependence: Secondary | ICD-10-CM | POA: Diagnosis not present

## 2024-04-24 DIAGNOSIS — R490 Dysphonia: Secondary | ICD-10-CM | POA: Diagnosis not present

## 2024-04-24 DIAGNOSIS — J383 Other diseases of vocal cords: Secondary | ICD-10-CM | POA: Diagnosis not present

## 2024-04-24 DIAGNOSIS — K219 Gastro-esophageal reflux disease without esophagitis: Secondary | ICD-10-CM | POA: Diagnosis not present

## 2024-04-24 DIAGNOSIS — R053 Chronic cough: Secondary | ICD-10-CM

## 2024-04-24 MED ORDER — FLUTICASONE PROPIONATE 50 MCG/ACT NA SUSP: 2.0000 | Freq: Every day | NASAL | 6 refills | Status: AC

## 2024-04-24 MED ORDER — LEVOCETIRIZINE DIHYDROCHLORIDE 5 MG PO TABS: 5.0000 mg | ORAL_TABLET | Freq: Every evening | ORAL | 3 refills | Status: AC

## 2024-04-24 NOTE — Progress Notes (Signed)
 ENT CONSULT:  Reason for Consult: hoarseness   HPI: Discussed the use of AI scribe software for clinical note transcription with the patient, who gave verbal consent to proceed.  History of Present Illness Sean Young is a 63 year old male who presents with a raspy voice persisting for three to four years.  He has experienced a persistent change in voice quality, describing it as 'raspy' for the past three to four years. There was no preceding illness or surgery noted before the onset of symptoms. He experiences gagging when consuming sweet foods, such as candy.  He has a history of heartburn and takes medication for it, although the specific medication and dosage were not mentioned. He also uses a saline spray to manage postnasal drainage. No recent illness or surgery was reported prior to the onset of his voice changes.  He quit smoking in 2023 and has a history of working in [employer], which involved exposure to chemicals. He expressed worry about his age and his history of smoking.   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   Past Medical History:  Diagnosis Date   Aftercare following bilateral finger joint replacement surgery    Aftercare following left ankle joint replacement surgery    Anxiety    Bipolar 1 disorder (HCC)    Hx of cholecystectomy     Past Surgical History:  Procedure Laterality Date   ANKLE SURGERY     CHOLECYSTECTOMY     FINGER SURGERY      History reviewed. No pertinent family history.  Social History:  reports that he quit smoking about 11 years ago. His smoking use included cigarettes. He has quit using smokeless tobacco. He reports current alcohol use. He reports that he does not use  drugs.  Allergies:  Allergies  Allergen Reactions   Tramadol Nausea And Vomiting    Medications: I have reviewed the patient's current medications.  The PMH, PSH, Medications, Allergies, and SH were reviewed and updated.  ROS: Constitutional: Negative for fever, weight loss and weight gain. Cardiovascular: Negative for chest pain and dyspnea on exertion. Respiratory: Is not experiencing shortness of breath at rest. Gastrointestinal: Negative for nausea and vomiting. Neurological: Negative for headaches. Psychiatric: The patient is not nervous/anxious  Blood pressure 124/78, pulse 78, SpO2 95%. There is no height or weight on file to calculate BMI.  PHYSICAL EXAM:  Exam: General: Well-developed, well-nourished Communication and Voice: raspy Respiratory Respiratory effort: Equal inspiration and expiration without stridor Cardiovascular Peripheral Vascular: Warm extremities with equal color/perfusion Eyes: No nystagmus with equal extraocular motion bilaterally Neuro/Psych/Balance: Patient oriented to person, place, and time; Appropriate mood and affect; Gait is intact with no imbalance; Cranial nerves I-XII are intact Head and Face Inspection: Normocephalic and atraumatic without mass or lesion Palpation: Facial skeleton intact without bony stepoffs Salivary Glands: No mass or tenderness Facial Strength: Facial motility symmetric and full bilaterally ENT Pinna: External ear intact and fully developed External canal: Canal is patent with intact skin Tympanic Membrane: Clear and mobile External Nose: No scar or anatomic deformity Internal Nose: Septum is deviated to the left. No polyp, or purulence. Mucosal edema and erythema present.  Bilateral inferior turbinate hypertrophy.  Lips, Teeth, and  gums: Mucosa and teeth intact and viable TMJ: No pain to palpation with full mobility Oral cavity/oropharynx: No erythema or exudate, no lesions present Nasopharynx: No mass or  lesion with intact mucosa Hypopharynx: Intact mucosa without pooling of secretions Larynx Glottic: Full true vocal cord mobility without lesion or mass Supraglottic: Normal appearing epiglottis and AE folds Interarytenoid Space: Moderate pachydermia&edema Subglottic Space: Patent without lesion or edema Neck Neck and Trachea: Midline trachea without mass or lesion Thyroid: No mass or nodularity Lymphatics: No lymphadenopathy  Procedure:  Preoperative diagnosis: hoarseness  Postoperative diagnosis:   same + VF atrophy  Procedure: Flexible fiberoptic laryngoscopy with stroboscopy (68420)   Surgeon: Sudais Banghart, MD  Anesthesia: Topical lidocaine  and Afrin  Complications: None  Condition is stable throughout exam  Indications and consent:   The patient presents to the clinic with hoarseness. All the risks, benefits, and potential complications were reviewed with the patient preoperatively and informed verbal consent was obtained.  Procedure: The patient was seated upright in the exam chair.   Topical lidocaine  and Afrin were applied to the nasal cavity. After adequate anesthesia had occurred, the flexible telescope with strobe capabilities was passed into the nasal cavity. The nasopharynx was patent without mass or lesion. The scope was passed behind the soft palate and directed toward the base of tongue. The base of tongue was visualized and was symmetric with no apparent masses or abnormal appearing tissue. There were no signs of a mass or pooling of secretions in the piriform sinuses. The supraglottic structures were normal.  The true vocal cords are mobile. The medial edges were bowed. Closure was incomplete. Periodicity present. The mucosal wave and amplitude were normal and symmetric. There is moderate interarytenoid pachydermia and post cricoid edema. The mucosa appears without lesions.   The laryngoscope was then slowly withdrawn and the patient tolerated the procedure well.  There were no complications or blood loss.  Studies Reviewed: CXR 03/05/20 IMPRESSION: 1. Old right-sided sixth and seventh rib fractures redemonstrated with some adjacent pleural thickening. No pneumothorax or other acute findings.  Assessment/Plan: Encounter Diagnoses  Name Primary?   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 No masses or lesions. Incomplete closure affects voice quality.  - Discussed voice therapy with speech therapist, but he would like to hold off for now.   Laryngopharyngeal reflux Reflux changes noted on scope exam. On heartburn medication. - continue Protonix  40 mg daily -  Reflux Gourmet after meals - diet and lifestyle changes to minimize GERD - Refer to [employer] blog for dietary and lifestyle modifications/reflux cook book  Chronic postnasal drip and nasal congestion  Postnasal drip affects vocal cords. Uses saline spray. - Prescribed Xyzal  at night for postnasal drainage - continue Flonase     Thank you for allowing me to participate in the care of this patient. Please do not hesitate to contact me with any questions or concerns.   Elena Larry, MD Otolaryngology [facility] Phone: [phone] Fax: [phone]    04/24/2024, 10:05 AM

## 2024-04-24 NOTE — Patient Instructions (Signed)

## 2024-05-03 NOTE — Progress Notes (Addendum)
 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   History of Present Illness  03/23/2024  Pulmonary/ 1st office eval/ Chrisandra Wiemers / [place] Office maint on Prednsione x 2025 on Breztri   Chief Complaint  Patient presents with   Shortness of Breath    Coughing / mucus - yellow and thick  Dyspnea:  [employer] x 3 aisle pushing cart / better p saba  Cough: beige mucus  Sleep: on couch leaning on arm and 3 pillows x year  SABA use: using p exertion  02: noe  LDSCT:ordered   Rec   For cough/ congestion > mucinex or mucinex dm  up to maximum of  1200 mg every 12 hours and use the flutter valve as much as you can  Plan A = Automatic = Always=    Breztri  Take 2 puffs first thing in am and then another 2 puffs about 12 hours later.   Work on [employer] B = Backup (to supplement plan A, not to replace it) Use your albuterol  inhaler as a rescue medication Plan C = Crisis (instead of Plan B but only if Plan B stops working) - only use your albuterol  nebulizer if you first try Plan B  Pantoprazole  (protonix ) 40 mg   Take  30-60 min before first meal of the day and Pepcid  (famotidine )  20 mg after supper until return to office Sugarless jolly ranchers or life savers to keep from clearing throat - no mints Please schedule a follow up office visit in 6 weeks, call sooner if needed with all medications    LDSct  04/22/24  RADS2 Centrilobular and paraseptal emphysema   [place] ent soldiativa neg for ca   05/07/2024  f/u ov/[place] office/Errol Ala re: emphysema / AB  maint on Breeztri did  bring meds and  just finished  last prednisone  at 20 mg on day of ov  Chief Complaint  Patient presents with   Follow-up     Review PFT from today and lung cancer screening testing  Dyspnea:  more sedentary than baseline  Cough: still globus Sleeping: couch @  45 degrees using couch arm and pillows     SABA use: 16  per days 02: none   Lung cancer screening: q sept    No obvious day to day or daytime variability or assoc excess/ purulent sputum or mucus plugs or hemoptysis or cp or chest tightness, subjective wheeze or overt sinus or hb symptoms.    Also denies any obvious fluctuation of symptoms with weather or environmental changes or other aggravating or alleviating factors except as outlined above   No unusual exposure hx or h/o childhood pna  or knowledge of premature birth.  Current Allergies, Complete Past Medical History, Past Surgical History, Family History, and Social History were reviewed in [employer] record.  ROS  The following are not active complaints unless bolded Hoarseness, sore throat, dysphagia, dental problems, itching, sneezing,  nasal congestion or discharge of excess mucus or purulent secretions, ear ache,   fever, chills, sweats, unintended wt loss or wt gain, classically pleuritic or exertional cp,  orthopnea pnd or arm/hand swelling  or  leg swelling, presyncope, palpitations, abdominal pain, anorexia, nausea, vomiting, diarrhea  or change in bowel habits or change in bladder habits, change in stools or change in urine, dysuria, hematuria,  rash, arthralgias, visual complaints, headache, numbness, weakness or ataxia or problems with walking or coordination,  change in mood or  memory.        Current Meds  Medication Sig   albuterol  (VENTOLIN  HFA) 108 (90 Base) MCG/ACT inhaler 2 puffs every 4 hours as needed   amitriptyline  (ELAVIL ) 100 MG tablet Take 1 tablet (100 mg total) by mouth at bedtime.   budesonide-glycopyrrolate-formoterol (BREZTRI  AEROSPHERE) 160-9-4.8 MCG/ACT AERO inhaler Take 2 puffs first thing in am and then another 2 puffs about 12 hours later.    budesonide-glycopyrrolate-formoterol (BREZTRI  AEROSPHERE) 160-9-4.8 MCG/ACT AERO inhaler Inhale 2 puffs into the lungs in the morning and at bedtime.   famotidine  (PEPCID ) 20 MG tablet One after supper   fluticasone  (FLONASE ) 50 MCG/ACT nasal spray Place 2 sprays into both nostrils daily.   levocetirizine (XYZAL  ALLERGY 24HR) 5 MG tablet Take 1 tablet (5 mg total) by mouth every evening.   pantoprazole  (PROTONIX ) 40 MG tablet Take 1 tablet (40 mg total) by mouth daily.   predniSONE  (DELTASONE ) 10 MG tablet 2 each am until breathing better then 1 each am   sulfamethoxazole -trimethoprim  (SEPTRA  DS) 800-160 MG tablet Take 1 tablet by mouth every 12 (twelve) hours.   [DISCONTINUED] predniSONE  (DELTASONE ) 20 MG tablet Take 1 tablet (20 mg total) by mouth daily with breakfast.         Past Medical History:  Diagnosis Date   Aftercare following bilateral finger joint replacement surgery    Aftercare following left ankle joint replacement surgery    Anxiety    Bipolar 1 disorder (HCC)    Hx of cholecystectomy       Objective:    Wts   05/07/2024       210   03/23/24 212 lb 3.2 oz (96.3 kg)  02/05/20 190 lb (86.2 kg)  03/08/14 180 lb (81.6 kg)      Vital signs reviewed  05/07/2024  - Note at rest 02 sats  94% on RA   General appearance:    Hoarse  amb wm with  freq throat grinding       HEENT :  Oropharynx  clear   Nasal turbinates nl    NECK :  without JVD/Nodes/TM/ nl carotid upstrokes bilaterally   LUNGS: no acc muscle use,  Mod barrel  contour chest wall with bilateral  Distant pan exp wheeze and  without cough on insp or exp maneuvers and mod  Hyperresonant  to  percussion bilaterally     CV:  RRR  no s3 or murmur or increase in P2, and no edema   ABD:  soft and nontender with pos mid insp Hoover's  in the supine position. No bruits or organomegaly appreciated, bowel sounds nl  MS:   Ext warm without deformities or   obvious joint restrictions , calf tenderness, cyanosis  or clubbing  SKIN: warm and dry without lesions    NEURO:  alert, approp, nl sensorium with  no motor or cerebellar deficits apparent.           Assessment   Assessment & Plan COPD  GOLD 4 Asthma as child/ quit smoking completely in 2023 on daily inhalers since age 35 and daily prednisone  since in his mid 51's - 03/23/2024   Walked on RA  x  3  lap(s) =  approx 450  ft  @ mod pace, stopped due to end of study  with lowest 02 sats 91% and mild sob  - 03/23/2024  After extensive coaching inhaler device,  effectiveness =    75% short ti) from a basline of 50% > continue  breztri  2bid and try for more approp saba and wean pred if feasible/ rz protonix   40 qam9/25/2025  After extensive coaching inhaler device,  effectiveness =  80% with hfa (short ti)  -- PFT's  05/07/2024   FEV1 0.89 (25 % ) ratio 0.38  p 17 % improvement from saba p Brezri prior to study with DLCO  18.81 (69%)   and FV curve severely concave   -LDSct  04/22/24   Centrilobular and paraseptal emphysema  - 05/07/2024  After extensive coaching inhaler device,  effectiveness =    75% (still Ti too short)   >>>  05/07/2024 ordered ohtuvayre  and pred ceiling 20 and floor 10 mg per day for now     Group E in terms of symptoms/risk so  laba/lama/ICS  therefore appropriate rx at this point >>>  breztri    and approp SABA prn but still unable to wean prednisone .   As per guidelines for refractory doe in setting of very  severe copd will add ohtuvayre  with goal of minimizing pred rx and optimizing approp uses of saba:  Re SABA :  I spent extra time with pt today reviewing appropriate use of albuterol  for prn use on exertion with the following points: 1) saba is for relief of sob that does not improve by walking a slower pace or resting but rather if the pt does not improve after trying this first. 2) If the pt is convinced, as many are, that saba helps recover from activity faster then it's easy to tell if this is the case by re-challenging  : ie stop, take the inhaler, then p 5 minutes try the exact same activity (intensity of workload) that just caused the symptoms and see if they are substantially diminished or not after saba 3) if there is an activity that reproducibly causes the symptoms, try the saba 15 min before the activity on alternate days   If in fact the saba really does help, then fine to continue to use it prn but advised may need to look closer at the maintenance regimen being used to achieve better control of airways disease with exertion.          Each maintenance medication was reviewed in detail including emphasizing most importantly the difference between maintenance and prns and under what circumstances the prns are to be triggered using an action plan format where appropriate.  Total time for H and P, chart review, counseling, reviewing hfa/ neb device(s) and generating customized AVS unique to this office visit / same day charting =         AVS  Patient Instructions  Plan A = Automatic = Always    #1  Breztri  Take 2 puffs first thing in am and then another 2 puffs about 12 hours later.    Work on inhaler technique:  relax and gently blow all the way out then take a nice smooth full deep breath back in, triggering the inhaler at same time you start breathing in.  Hold breath in for at least  5 seconds if you can. Blow out breztri   thru nose. Rinse and gargle with water when done.  If mouth or throat bother you at all,  try brushing teeth/gums/tongue with arm and hammer toothpaste/ make a slurry and gargle and spit out.   #2  Ohtuvayre  one vial after am and pm breztri    #3  Prednisone  10 mg x 2 until better then 1 daily     Plan B = Backup (to supplement plan A, not to replace it) Use your albuterol  inhaler as a rescue medication to be used if you can't catch your breath by resting or slowing your pace  or doing a relaxed purse lip breathing pattern.  - The less you use it, the better it will  work when you need it. - Ok to use the inhaler up to 2 puffs  every 4 hours if you must but call for appointment if use goes up over your usual need - Don't leave home without it !!  (think of it like the spare tire or starter fluid for your car)   Plan C = Crisis (instead of Plan B but only if Plan B stops working) - only use your albuterol  nebulizer if you first try Plan B and it fails to help > ok to use the nebulizer up to every 4 hours but if start needing it regularly call for immediate appointment  Pantoprazole  (protonix ) 40 mg   Take  30-60 min before first meal of the day and Pepcid  (famotidine )  20 mg after supper until return to office - this is the best way to tell whether stomach acid is contributing to your problem.    GERD (REFLUX)  is an extremely common cause of respiratory symptoms just like yours , many times with no obvious heartburn at all.    It can be treated with medication, but also with lifestyle changes including elevation of the head of your bed (ideally with 6 -8inch blocks under the headboard of your bed),  Smoking cessation, avoidance of late meals, excessive alcohol, and avoid fatty foods, chocolate, peppermint, colas, red wine, and acidic juices such as orange juice.  NO MINT OR MENTHOL PRODUCTS SO NO COUGH DROPS  USE SUGARLESS CANDY INSTEAD (Jolley ranchers or Stover's or Life Savers) or even ice chips will also do - the key is to swallow to prevent all throat clearing. NO OIL BASED VITAMINS - use powdered substitutes.  Avoid fish oil when coughing.    Please schedule a follow up visit in 3 months but call sooner if needed - bring all active meds, inhaler and inhaler solutions            Ozell America, MD 05/08/2024

## 2024-05-04 ENCOUNTER — Other Ambulatory Visit: Payer: Self-pay

## 2024-05-04 DIAGNOSIS — Z87891 Personal history of nicotine dependence: Secondary | ICD-10-CM

## 2024-05-04 DIAGNOSIS — Z122 Encounter for screening for malignant neoplasm of respiratory organs: Secondary | ICD-10-CM

## 2024-05-07 ENCOUNTER — Ambulatory Visit (HOSPITAL_COMMUNITY)
Admission: RE | Admit: 2024-05-07 | Discharge: 2024-05-07 | Disposition: A | Discharge status: Home / Self Care | Source: Ambulatory Visit | Attending: Internal Medicine | Admitting: Internal Medicine

## 2024-05-07 ENCOUNTER — Ambulatory Visit: Admitting: Internal Medicine

## 2024-05-07 ENCOUNTER — Encounter: Payer: Self-pay | Admitting: Internal Medicine

## 2024-05-07 VITALS — BP 131/81 | HR 106 | Temp 98.1°F | Ht 70.0 in | Wt 210.2 lb

## 2024-05-07 DIAGNOSIS — Z87891 Personal history of nicotine dependence: Secondary | ICD-10-CM | POA: Diagnosis not present

## 2024-05-07 DIAGNOSIS — J449 Chronic obstructive pulmonary disease, unspecified: Secondary | ICD-10-CM

## 2024-05-07 DIAGNOSIS — R0609 Other forms of dyspnea: Secondary | ICD-10-CM | POA: Insufficient documentation

## 2024-05-07 LAB — PULMONARY FUNCTION TEST
DL/VA % pred: 87 %
DL/VA: 3.64 ml/min/mmHg/L
DLCO unc % pred: 69 %
DLCO unc: 18.81 ml/min/mmHg
FEF 25-75 Post: 0.47 L/s
FEF 25-75 Pre: 0.3 L/s
FEF2575-%Change-Post: 57 %
FEF2575-%Pred-Post: 16 %
FEF2575-%Pred-Pre: 10 %
FEV1-%Change-Post: 17 %
FEV1-%Pred-Post: 25 %
FEV1-%Pred-Pre: 21 %
FEV1-Post: 0.89 L
FEV1-Pre: 0.76 L
FEV1FVC-%Change-Post: -5 %
FEV1FVC-%Pred-Pre: 53 %
FEV6-%Change-Post: 22 %
FEV6-%Pred-Post: 48 %
FEV6-%Pred-Pre: 39 %
FEV6-Post: 2.16 L
FEV6-Pre: 1.76 L
FEV6FVC-%Change-Post: -1 %
FEV6FVC-%Pred-Post: 96 %
FEV6FVC-%Pred-Pre: 98 %
FVC-%Change-Post: 24 %
FVC-%Pred-Post: 50 %
FVC-%Pred-Pre: 40 %
FVC-Post: 2.35 L
FVC-Pre: 1.89 L
Post FEV1/FVC ratio: 38 %
Post FEV6/FVC ratio: 92 %
Pre FEV1/FVC ratio: 40 %
Pre FEV6/FVC Ratio: 93 %
RV % pred: 240 %
RV: 5.53 L
TLC % pred: 121 %
TLC: 8.49 L

## 2024-05-07 MED ORDER — PREDNISONE 10 MG PO TABS: ORAL_TABLET | ORAL | 3 refills | Status: AC

## 2024-05-07 MED ORDER — FAMOTIDINE 20 MG PO TABS: ORAL_TABLET | ORAL | 11 refills | Status: AC

## 2024-05-07 MED ORDER — ALBUTEROL SULFATE (2.5 MG/3ML) 0.083% IN NEBU
2.5000 mg | INHALATION_SOLUTION | Freq: Once | RESPIRATORY_TRACT | Status: AC
  Administered 2024-05-07: 2.5 mg via RESPIRATORY_TRACT

## 2024-05-07 MED ORDER — BREZTRI AEROSPHERE 160-9-4.8 MCG/ACT IN AERO: 2.0000 | INHALATION_SPRAY | Freq: Two times a day (BID) | RESPIRATORY_TRACT | Status: AC

## 2024-05-07 MED ORDER — AMITRIPTYLINE HCL 100 MG PO TABS: 100.0000 mg | ORAL_TABLET | Freq: Every day | ORAL | 2 refills | Status: DC

## 2024-05-07 NOTE — Patient Instructions (Addendum)
 Plan A = Automatic = Always=     #1  Breztri  Take 2 puffs first thing in am and then another 2 puffs about 12 hours later.    Work on inhaler technique:  relax and gently blow all the way out then take a nice smooth full deep breath back in, triggering the inhaler at same time you start breathing in.  Hold breath in for at least  5 seconds if you can. Blow out breztri   thru nose. Rinse and gargle with water when done.  If mouth or throat bother you at all,  try brushing teeth/gums/tongue with arm and hammer toothpaste/ make a slurry and gargle and spit out.   #2  Ohtuvayre  one vial after am and pm breztri    #3  Prednisone  10 mg x 2 until better then 1 daily     Plan B = Backup (to supplement plan A, not to replace it) Use your albuterol  inhaler as a rescue medication to be used if you can't catch your breath by resting or slowing your pace  or doing a relaxed purse lip breathing pattern.  - The less you use it, the better it will work when you need it. - Ok to use the inhaler up to 2 puffs  every 4 hours if you must but call for appointment if use goes up over your usual need - Don't leave home without it !!  (think of it like the spare tire or starter fluid for your car)   Plan C = Crisis (instead of Plan B but only if Plan B stops working) - only use your albuterol  nebulizer if you first try Plan B and it fails to help > ok to use the nebulizer up to every 4 hours but if start needing it regularly call for immediate appointment  Pantoprazole  (protonix ) 40 mg   Take  30-60 min before first meal of the day and Pepcid  (famotidine )  20 mg after supper until return to office - this is the best way to tell whether stomach acid is contributing to your problem.    GERD (REFLUX)  is an extremely common cause of respiratory symptoms just like yours , many times with no obvious heartburn at all.    It can be treated with medication, but also with lifestyle changes including elevation of the head of  your bed (ideally with 6 -8inch blocks under the headboard of your bed),  Smoking cessation, avoidance of late meals, excessive alcohol, and avoid fatty foods, chocolate, peppermint, colas, red wine, and acidic juices such as orange juice.  NO MINT OR MENTHOL PRODUCTS SO NO COUGH DROPS  USE SUGARLESS CANDY INSTEAD (Jolley ranchers or Stover's or Life Savers) or even ice chips will also do - the key is to swallow to prevent all throat clearing. NO OIL BASED VITAMINS - use powdered substitutes.  Avoid fish oil when coughing.    Please schedule a follow up visit in 3 months but call sooner if needed - bring all active meds, inhaler and inhaler solutions

## 2024-05-08 NOTE — Assessment & Plan Note (Addendum)
 Asthma as child/ quit smoking completely in 2023 on daily inhalers since age 63 and daily prednisone  since in his mid 96's - 03/23/2024   Walked on RA  x  3  lap(s) =  approx 450  ft  @ mod pace, stopped due to end of study  with lowest 02 sats 91% and mild sob  - 03/23/2024  After extensive coaching inhaler device,  effectiveness =    75% short ti) from a basline of 50% > continue  breztri  2bid and try for more approp saba and wean pred if feasible/ rz protonix   40 qam9/25/2025  After extensive coaching inhaler device,  effectiveness =  80% with hfa (short ti)  -- PFT's  05/07/2024   FEV1 0.89 (25 % ) ratio 0.38  p 17 % improvement from saba p Brezri prior to study with DLCO  18.81 (69%)   and FV curve severely concave   -LDSct  04/22/24   Centrilobular and paraseptal emphysema  - 05/07/2024  After extensive coaching inhaler device,  effectiveness =    75% (still Ti too short)   >>>  05/07/2024 ordered ohtuvayre  and pred ceiling 20 and floor 10 mg per day for now     Group E in terms of symptoms/risk so  laba/lama/ICS  therefore appropriate rx at this point >>>  breztri    and approp SABA prn but still unable to wean prednisone .   As per guidelines for refractory doe in setting of very  severe copd will add ohtuvayre  with goal of minimizing pred rx and optimizing approp uses of saba:  Re SABA :  I spent extra time with pt today reviewing appropriate use of albuterol  for prn use on exertion with the following points: 1) saba is for relief of sob that does not improve by walking a slower pace or resting but rather if the pt does not improve after trying this first. 2) If the pt is convinced, as many are, that saba helps recover from activity faster then it's easy to tell if this is the case by re-challenging : ie stop, take the inhaler, then p 5 minutes try the exact same activity (intensity of workload) that just caused the symptoms and see if they are substantially diminished or not after saba 3) if  there is an activity that reproducibly causes the symptoms, try the saba 15 min before the activity on alternate days   If in fact the saba really does help, then fine to continue to use it prn but advised may need to look closer at the maintenance regimen being used to achieve better control of airways disease with exertion.          Each maintenance medication was reviewed in detail including emphasizing most importantly the difference between maintenance and prns and under what circumstances the prns are to be triggered using an action plan format where appropriate.  Total time for H and P, chart review, counseling, reviewing hfa/ neb device(s) and generating customized AVS unique to this office visit / same day charting = 

## 2024-05-11 ENCOUNTER — Telehealth: Payer: Self-pay

## 2024-05-11 NOTE — Telephone Encounter (Signed)
 Received Ohtuvayre  new start paperwork. Completed form and faxed with clinicals and insurance card copy to [facility]   Phone#: [phone] Fax#: [phone]

## 2024-05-12 ENCOUNTER — Other Ambulatory Visit: Payer: Self-pay | Admitting: Internal Medicine

## 2024-05-14 NOTE — Telephone Encounter (Signed)
 Received fax from [facility]. Ohtuvayre  rx was triaged to this pharmacy. Will contact patient once ready for scheduling  Pharmacy phone: [phone]

## 2024-05-15 ENCOUNTER — Other Ambulatory Visit (HOSPITAL_COMMUNITY): Payer: Self-pay

## 2024-05-15 NOTE — Telephone Encounter (Signed)
 Received notification from [facility] MEDICAID regarding a prior authorization for OHTUVAYRE . Authorization has been APPROVED from 05/15/24 to 05/15/25. Approval letter sent to scan center.  Per test claim, copay for 30 days supply is $4  Authorization # [id number] Phone # [phone]

## 2024-05-15 NOTE — Telephone Encounter (Signed)
 Received fax from [facility] stating a pa is needed for Ohtuvayre . Submitted a Prior Authorization request to [employer] for OHTUVAYRE  via CoverMyMeds. Will update once we receive a response.  Key: [id number]

## 2024-06-26 ENCOUNTER — Telehealth: Payer: Self-pay

## 2024-06-26 MED ORDER — ALBUTEROL SULFATE HFA 108 (90 BASE) MCG/ACT IN AERS: INHALATION_SPRAY | RESPIRATORY_TRACT | 3 refills | Status: DC

## 2024-06-26 NOTE — Telephone Encounter (Signed)
 Copied from CRM [id number]. Topic: Clinical - Prescription Issue >> Jun 25, 2024  9:51 AM [name] wrote: Reason for CRM: Patient [id number] states he usually gets two quantity of albuterol  (VENTOLIN  HFA) 108 (90 Base) MCG/ACT inhaler every month and sulfamethoxazole -trimethoprim  (SEPTRA  DS) 800-160 MG tablet to take Monday, Wednesday and Friday to prevent upper respiratory infection. Patient states [facility] does not have the prescriptions. Patient states is not having any symptoms right now and does not want to run out of medications. Please advise and call back.   [facility] [place], [place] -  [id number] [place] [address] Phone: [phone] Fax: [phone]  Can I refill SEPTRA  DS) 800-160 MG

## 2024-06-29 MED ORDER — ALBUTEROL SULFATE HFA 108 (90 BASE) MCG/ACT IN AERS: INHALATION_SPRAY | RESPIRATORY_TRACT | 3 refills | Status: AC

## 2024-06-29 MED ORDER — SULFAMETHOXAZOLE-TRIMETHOPRIM 800-160 MG PO TABS: 1.0000 | ORAL_TABLET | ORAL | 11 refills | Status: AC

## 2024-06-29 NOTE — Telephone Encounter (Signed)
 Copied from CRM #[id number]. Topic: Clinical - Prescription Issue >> Jun 29, 2024  3:13 PM Corean SAUNDERS wrote: Reason for CRM: Patient states his pharmacy has not received his albuterol  (VENTOLIN  HFA) 108 (90 Base) MCG/ACT inhaler (2)  OR his antibiotic that he takes Monday, Wednesday, and Friday.  Patient states these are his lifelines and he is almost out of the medication and is requesting Dr. Darlean to please send these to: [facility] [place], [place] - [address] [place] [address] Phone: [phone] Fax: [phone]  Sam has already sent the albuterol . Dr Darlean, should he be taking bactrim  three times per wk?

## 2024-06-29 NOTE — Telephone Encounter (Signed)
 Yes, I meant to approve both   I have sent Bactrim  Spoke with the pt  He is asking for 2 albuterol  inhalers, not 3  I re sent the rx for this as well  Nothing further needed

## 2024-07-30 ENCOUNTER — Ambulatory Visit: Admitting: Internal Medicine

## 2024-08-10 ENCOUNTER — Ambulatory Visit: Admitting: Internal Medicine

## 2024-08-18 ENCOUNTER — Other Ambulatory Visit: Payer: Self-pay | Admitting: Internal Medicine

## 2024-09-08 ENCOUNTER — Ambulatory Visit: Admitting: Internal Medicine

## 2024-09-08 DIAGNOSIS — J449 Chronic obstructive pulmonary disease, unspecified: Secondary | ICD-10-CM

## 2024-09-08 NOTE — Progress Notes (Unsigned)
 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   History of Present Illness  03/23/2024  Pulmonary/ 1st office eval/ [name] / [place] [facility] maint on Prednsione x 2025 on Breztri   Chief Complaint  Patient presents with   Shortness of Breath    Coughing / mucus - yellow and thick  Dyspnea:  [employer] x 3 aisle pushing cart / better p saba  Cough: beige mucus  Sleep: on couch leaning on arm and 3 pillows x year  SABA use: using p exertion  02: noe  LDSCT:ordered   Rec   For cough/ congestion > mucinex or mucinex dm  up to maximum of  1200 mg every 12 hours and use the flutter valve as much as you can  Plan A = Automatic = Always=    Breztri  Take 2 puffs first thing in am and then another 2 puffs about 12 hours later.   Work on [employer]maker B = Backup (to supplement plan A, not to replace it) Use your albuterol  inhaler as a rescue medication Plan C = Crisis (instead of Plan B but only if Plan B stops working) - only use your albuterol  nebulizer if you first try Plan B  Pantoprazole  (protonix ) 40 mg   Take  30-60 min before first meal of the day and Pepcid  (famotidine )  20 mg after supper until return to office Sugarless jolly ranchers or life savers to keep from clearing throat - no mints Please schedule a follow up office visit in 6 weeks, call sooner if needed with all medications    LDSct  04/22/24  RADS2 Centrilobular and paraseptal emphysema   [place] ent soldiativa neg for ca   05/07/2024  f/u ov/[place] office/[name] re: emphysema / AB  maint on Breeztri did  bring meds and  just finished  last prednisone  at 20 mg on day of ov  Chief Complaint  Patient presents with   Follow-up     Review PFT from today and lung cancer screening testing  Dyspnea:  more sedentary than baseline  Cough: still globus Sleeping: couch @  45 degrees using couch arm and pillows     SABA use: 16  per days 02: none  Lung cancer screening: q sept   Patient Instructions  Plan A = Automatic = Always   #1  Breztri  Take 2 puffs first thing in am and then another 2 puffs about 12 hours later.  Work on inhaler technique:   #2  Ohtuvayre  one vial after am and pm breztri   #3  Prednisone  10 mg x 2 until better then 1 daily  Plan B = Backup (to supplement plan A, not to replace it) Use your albuterol  inhaler as a rescue medication Plan C = Crisis (instead of Plan B but only if Plan B stops working) - only use your albuterol  nebulizer if you first try Plan B Pantoprazole  (protonix ) 40 mg   Take  30-60 min before first meal of the day and Pepcid  (famotidine )  20 mg after supper until return to office GERD diet reviewed, bed blocks rec  Please schedule a follow up visit in 3  months but call sooner if needed - bring all active meds, inhaler and inhaler solutions      09/08/2024  f/u ov/ office/[name] re: GOLD 4 copd  maint on ***  No chief complaint on file.   Dyspnea:  *** Cough: *** Sleeping: ***   resp cc  SABA use: *** 02: ***  Lung cancer screening: ***   No obvious day to day or daytime variability or assoc excess/ purulent sputum or mucus plugs or hemoptysis or cp or chest tightness, subjective wheeze or overt sinus or hb symptoms.    Also denies any obvious fluctuation of symptoms with weather or environmental changes or other aggravating or alleviating factors except as outlined above   No unusual exposure hx or h/o childhood pna/ asthma or knowledge of premature birth.  Current Allergies, Complete Past Medical History, Past Surgical History, Family History, and Social History were reviewed in [employer] record.  ROS  The following are not active  complaints unless bolded Hoarseness, sore throat, dysphagia, dental problems, itching, sneezing,  nasal congestion or discharge of excess mucus or purulent secretions, ear ache,   fever, chills, sweats, unintended wt loss or wt gain, classically pleuritic or exertional cp,  orthopnea pnd or arm/hand swelling  or leg swelling, presyncope, palpitations, abdominal pain, anorexia, nausea, vomiting, diarrhea  or change in bowel habits or change in bladder habits, change in stools or change in urine, dysuria, hematuria,  rash, arthralgias, visual complaints, headache, numbness, weakness or ataxia or problems with walking or coordination,  change in mood or  memory.         Outpatient Medications Prior to Visit  Medication Sig Dispense Refill   albuterol  (VENTOLIN  HFA) 108 (90 Base) MCG/ACT inhaler 2 puffs every 4 hours as needed 2 each 3   amitriptyline  (ELAVIL ) 100 MG tablet Take 1 tablet (100 mg total) by mouth at bedtime. 30 tablet 2   budesonide-glycopyrrolate-formoterol (BREZTRI  AEROSPHERE) 160-9-4.8 MCG/ACT AERO inhaler Take 2 puffs first thing in am and then another 2 puffs about 12 hours later. 10.7 g 11   budesonide-glycopyrrolate-formoterol (BREZTRI  AEROSPHERE) 160-9-4.8 MCG/ACT AERO inhaler Inhale 2 puffs into the lungs in the morning and at bedtime.     famotidine  (PEPCID ) 20 MG tablet One after supper 30 tablet 11   fluticasone  (FLONASE ) 50 MCG/ACT nasal spray Place 2 sprays into both nostrils daily. 16 g 6   levocetirizine (XYZAL  ALLERGY 24HR) 5 MG tablet Take 1 tablet (5 mg total) by mouth every evening. 30 tablet 3   pantoprazole  (PROTONIX ) 40 MG tablet Take 1 tablet (40 mg total) by mouth daily. 30 tablet 5   predniSONE  (DELTASONE ) 10 MG tablet 2 each am until breathing better then 1 each am 100 tablet 3   sulfamethoxazole -trimethoprim  (SEPTRA  DS) 800-160 MG tablet Take 1 tablet by mouth 3 (three) times a week. 12 tablet 11   No facility-administered medications prior to visit.      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

## 2024-09-11 ENCOUNTER — Ambulatory Visit: Admitting: Family Medicine

## 2025-01-06 ENCOUNTER — Ambulatory Visit: Payer: Self-pay
